# Patient Record
Sex: Female | Born: 1990
Health system: Southern US, Community
[De-identification: ages and names within clinical notes are randomized; demographics above are authoritative.]

## PROBLEM LIST (undated history)

## (undated) HISTORY — PX: NO PAST SURGERIES: SHX2092

---

## 2015-01-11 LAB — HM PAP SMEAR: HM Pap smear: NEGATIVE

## 2018-05-20 LAB — HM PAP SMEAR: HM Pap smear: NEGATIVE

## 2019-07-28 MED FILL — CARISOPRODOL 350 MG TABS: 350 | 30 days supply | Qty: 90 | Fill #0

## 2019-07-28 MED FILL — buPROPion HCL ER (XL) 150 M: 150 | 90 days supply | Qty: 90 | Fill #0

## 2019-08-17 ENCOUNTER — Ambulatory Visit (INDEPENDENT_AMBULATORY_CARE_PROVIDER_SITE_OTHER): Payer: No Typology Code available for payment source | Admitting: Physician Assistant

## 2019-08-17 ENCOUNTER — Encounter: Payer: Self-pay | Admitting: Physician Assistant

## 2019-08-17 ENCOUNTER — Encounter

## 2019-08-17 ENCOUNTER — Other Ambulatory Visit: Payer: Self-pay

## 2019-08-17 VITALS — BP 102/62 | HR 116 | Temp 97.1°F | Ht 64.0 in | Wt 114.0 lb

## 2019-08-17 DIAGNOSIS — M542 Cervicalgia: Secondary | ICD-10-CM | POA: Diagnosis not present

## 2019-08-17 DIAGNOSIS — Z30011 Encounter for initial prescription of contraceptive pills: Secondary | ICD-10-CM

## 2019-08-17 DIAGNOSIS — F329 Major depressive disorder, single episode, unspecified: Secondary | ICD-10-CM

## 2019-08-17 DIAGNOSIS — R222 Localized swelling, mass and lump, trunk: Secondary | ICD-10-CM | POA: Diagnosis not present

## 2019-08-17 DIAGNOSIS — F32A Depression, unspecified: Secondary | ICD-10-CM

## 2019-08-17 DIAGNOSIS — F419 Anxiety disorder, unspecified: Secondary | ICD-10-CM

## 2019-08-17 MED ORDER — BUPROPION HCL ER (XL) 150 MG PO TB24: 150.0000 mg | ORAL_TABLET | Freq: Every day | ORAL | 3 refills | Status: DC

## 2019-08-17 MED ORDER — LO LOESTRIN FE 1 MG-10 MCG / 10 MCG PO TABS: ORAL_TABLET | ORAL | 3 refills | Status: DC

## 2019-08-17 NOTE — Progress Notes (Signed)
Patient: Stephanie Russell Female    DOB: [birth year]   28 y.o.   MRN: [id number] Visit Date: 08/17/2019  Today's Provider: [name], PA-C   Chief Complaint  Patient presents with  . Establish Care   Subjective:     HPI   Stephanie Russell presents today to establish Care. She has recently moved to the area from [place], [place]. 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    Living in [place] with husband of two years who is currently working as a Research officer, political party. No children. Dog [employer] and [place] cat.   She believes she has had a tetanus shot in the past 10 years.   Depression: using wellbutrin 150 mg daily for several years and this is working well for her. She feels her mood is stable.     Office Visit from 08/17/2019 in [facility]  PHQ-9 Total Score  3     Contraception: On lo-loestrin. No history of heart attack, blood clot, or stroke. Doing well with this and she would like to continue.   Neck Pain. She reports this occurred after a work injury in 2018. She woke up and was unable to move it. Went to massage therapist for muscular injury after injury first happened. Saw chiropractor 6 months after that. Still reports daily pain. Most recently she had had facial tingling and also pain and throbbing behind her right eye. She denies nausea and vomiting but reports photophobia and phonophobia. She will take a Soma and sleep when this happens but will feel very sedated when she does this. She is interested in alternative treatments.   She has a lump in her lower right back that has gotten somewhat bigger since April. She denies redness, swelling, discharge, or pain.      Not on File   Current Outpatient Medications:  .  buPROPion (WELLBUTRIN XL) 150 MG 24 hr tablet, , Disp: , Rfl:  .  carisoprodol (SOMA) 350 MG tablet, , Disp: , Rfl:  .  hydrOXYzine (ATARAX/VISTARIL) 10 MG tablet, Take 10 mg by mouth every 8 (eight) hours  as needed., Disp: , Rfl:  .  LO LOESTRIN FE 1 MG-10 MCG / 10 MCG tablet, TK 1 T PO D, Disp: , Rfl:   Review of Systems  Constitutional: Negative.   HENT: Negative.   Eyes: Positive for photophobia. Negative for pain, discharge, redness, itching and visual disturbance.  Respiratory: Negative.   Cardiovascular: Negative.   Gastrointestinal: Negative.   Endocrine: Negative.   Genitourinary: Negative.   Musculoskeletal: Positive for neck pain. Negative for arthralgias, back pain, gait problem, joint swelling, myalgias and neck stiffness.  Skin: Negative.   Allergic/Immunologic: Negative.   Neurological: Positive for headaches. Negative for dizziness, tremors, seizures, syncope, facial asymmetry, speech difficulty, weakness, light-headedness and numbness.  Hematological: Negative.   Psychiatric/Behavioral: Negative for agitation, behavioral problems, confusion, decreased concentration, dysphoric mood, hallucinations, self-injury, sleep disturbance and suicidal ideas. The patient is nervous/anxious. The patient is not hyperactive.     Social History   Tobacco Use  . Smoking status: Never Smoker  . Smokeless tobacco: Never Used  Substance Use Topics  . Alcohol use: Yes      Objective:   BP 102/62 (BP Location: Left Arm, Patient Position: Sitting, Cuff Size: Normal)   Pulse (!) 116   Temp (!) 97.1 F (36.2 C) (Temporal)   Ht 5\' 4"  (1.626 m)   Wt  114 lb (51.7 kg)   SpO2 99%   BMI 19.57 kg/m  Vitals:   08/17/19 1501  BP: 102/62  Pulse: (!) 116  Temp: (!) 97.1 F (36.2 C)  TempSrc: Temporal  SpO2: 99%  Weight: 114 lb (51.7 kg)  Height: 5\' 4"  (1.626 m)  Body mass index is 19.57 kg/m.   Physical Exam Constitutional:      Appearance: Normal appearance. She is normal weight.  Neck:     Musculoskeletal: Normal range of motion. Pain with movement and muscular tenderness present.  Cardiovascular:     Rate and Rhythm: Normal rate and regular rhythm.     Heart sounds: Normal  heart sounds.  Pulmonary:     Effort: Pulmonary effort is normal.     Breath sounds: Normal breath sounds.  Musculoskeletal:       Arms:  Skin:    General: Skin is warm and dry.  Neurological:     Mental Status: She is alert and oriented to person, place, and time. Mental status is at baseline.  Psychiatric:        Mood and Affect: Mood normal.        Behavior: Behavior normal.      No results found for any visits on 08/17/19.     Assessment & Plan    1. Encounter for initial prescription of contraceptive pills  Refilled. Requesting PAP smear from OBGYN.   - LO LOESTRIN FE 1 MG-10 MCG / 10 MCG tablet; TK 1 T PO D  Dispense: 3 Package; Refill: 3  2. Anxiety and depression  Continue.  - buPROPion (WELLBUTRIN XL) 150 MG 24 hr tablet; Take 1 tablet (150 mg total) by mouth daily.  Dispense: 90 tablet; Refill: 3  3. Neck pain  Recommend PT. Could also consider cymbalta or referral to neurologist if not improving.  - Ambulatory referral to Physical Therapy  4. Palpable mass of lower back  Feels cystic. May refer to surgery. She is OK with observing for now but may refer to surgery at any time or if worsening.   The entirety of the information documented in the History of Present Illness, Review of Systems and Physical Exam were personally obtained by me. Portions of this information were initially documented by Ashley Royalty, CMA and reviewed by me for thoroughness and accuracy.   F/u 1 year CPE     [name], PA-C  [facility]

## 2019-08-17 NOTE — Patient Instructions (Signed)
Musculoskeletal Pain Musculoskeletal pain refers to aches and pains in your bones, joints, muscles, and the tissues that surround them. This pain can occur in any part of the body. It can last for a short time (acute) or a long time (chronic). A physical exam, lab tests, and imaging studies may be done to find the cause of your musculoskeletal pain. Follow these instructions at home:  Lifestyle  Try to control or lower your stress levels. Stress increases muscle tension and can worsen musculoskeletal pain. It is important to recognize when you are anxious or stressed and learn ways to manage it. This may include: ? Meditation or yoga. ? Cognitive or behavioral therapy. ? Acupuncture or massage therapy.  You may continue all activities unless the activities cause more pain. When the pain gets better, slowly resume your normal activities. Gradually increase the intensity and duration of your activities or exercise. Managing pain, stiffness, and swelling  Take over-the-counter and prescription medicines only as told by your health care provider.  When your pain is severe, bed rest may be helpful. Lie or sit in any position that is comfortable, but get out of bed and walk around at least every couple of hours.  If directed, apply heat to the affected area as often as told by your health care provider. Use the heat source that your health care provider recommends, such as a moist heat pack or a heating pad. ? Place a towel between your skin and the heat source. ? Leave the heat on for 20-30 minutes. ? Remove the heat if your skin turns bright red. This is especially important if you are unable to feel pain, heat, or cold. You may have a greater risk of getting burned.  If directed, put ice on the painful area. ? Put ice in a plastic bag. ? Place a towel between your skin and the bag. ? Leave the ice on for 20 minutes, 2-3 times a day. General instructions  Your health care provider may  recommend that you see a physical therapist. This person can help you come up with a safe exercise program. Do any exercises as told by your physical therapist.  Keep all follow-up visits, including any physical therapy visits, as told by your health care providers. This is important. Contact a health care provider if:  Your pain gets worse.  Medicines do not help ease your pain.  You cannot use the part of your body that hurts, such as your arm, leg, or neck.  You have trouble sleeping.  You have trouble doing your normal activities. Get help right away if:  You have a new injury and your pain is worse or different.  You feel numb or you have tingling in the painful area. Summary  Musculoskeletal pain refers to aches and pains in your bones, joints, muscles, and the tissues that surround them.  This pain can occur in any part of the body.  Your health care provider may recommend that you see a physical therapist. This person can help you come up with a safe exercise program. Do any exercises as told by your physical therapist.  Lower your stress level. Stress can worsen musculoskeletal pain. Ways to lower stress may include meditation, yoga, cognitive or behavioral therapy, acupuncture, and massage therapy. This information is not intended to replace advice given to you by your health care provider. Make sure you discuss any questions you have with your health care provider. Document Released: 11/02/2005 Document Revised: 10/15/2017 Document Reviewed:   12/02/2016 Elsevier Patient Education  2020 Elsevier Inc.  

## 2019-08-31 ENCOUNTER — Ambulatory Visit: Payer: No Typology Code available for payment source | Attending: Physician Assistant | Admitting: Physical Therapy

## 2019-08-31 ENCOUNTER — Encounter: Payer: Self-pay | Admitting: Physical Therapy

## 2019-08-31 ENCOUNTER — Other Ambulatory Visit: Payer: Self-pay

## 2019-08-31 DIAGNOSIS — M6281 Muscle weakness (generalized): Secondary | ICD-10-CM | POA: Diagnosis present

## 2019-08-31 DIAGNOSIS — M542 Cervicalgia: Secondary | ICD-10-CM | POA: Diagnosis present

## 2019-08-31 DIAGNOSIS — M5412 Radiculopathy, cervical region: Secondary | ICD-10-CM

## 2019-08-31 NOTE — Addendum Note (Signed)
Addended by: Rosita Kea R on: 08/31/2019 04:21 PM   Modules accepted: Orders

## 2019-08-31 NOTE — Therapy (Signed)
[facility] [address] [place], [address] Phone: [phone]   Fax:  [phone]  Physical Therapy Evaluation  Patient Details  Name: Stephanie Russell MRN: [id number] Date of Birth: 1991/08/30 Referring Provider (PT): [name], [place]   Encounter Date: 08/31/2019  PT End of Session - 08/31/19 [id number]    Visit Number  1    Number of Visits  12    Date for PT Re-Evaluation  10/12/19    Authorization Type  [facility] focus reporting from 08/31/2019    Authorization - Visit Number  1    Authorization - Number of Visits  10    PT Start Time  0900    PT Stop Time  0950    PT Time Calculation (min)  50 min    Activity Tolerance  Patient tolerated treatment well    Behavior During Therapy  [facility] for tasks assessed/performed       History reviewed. No pertinent past medical history.  Past Surgical History:  Procedure Laterality Date  . NO PAST SURGERIES      There were no vitals filed for this visit.   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 The pain is currently 2/10, and the worse pain can go up to 4/10. Patient has constant migraines and gradually develop numbness around R jaw and face. No previous TMJ issues.  Patient is currently a full time student. Patient enjoys doing hot yoga but not able to participate now due to neck and shoulder issue.    Pertinent History  Overall healthy and no past surgeries.    Limitations  Lifting;House hold activities    Patient Stated Goals  Reduce headache and neck pain; to be able to have better sleep    Currently in Pain?  Yes    Pain Score  2     Pain Location  Neck    Pain Orientation  Right    Pain Descriptors / Indicators  Aching;Constant    Pain Type  Chronic pain    Pain Radiating Towards   to R shoulder, headache, and jaw.    Pain Onset  More than a month ago    Pain Frequency  Intermittent    Aggravating Factors   worse at night, lifting up to 15 lb, pulling with R arm.    Pain Relieving Factors  pain medication; breaks    Effect of Pain on Daily Activities  Taking care of others, hot yoga practice.         [facility] PT Assessment - 08/31/19 0001      Assessment   Referring Provider (PT)  [name], PA-C    Onset Date/Surgical Date  --   December 2018    Hand Dominance  Right    Next MD Visit  --   in a year    Prior Therapy  None       Precautions   Precautions  None      Restrictions   Weight Bearing Restrictions  No      Home Environment   Living Environment  Private residence    Living Arrangements  Spouse/significant other    Available Help at Discharge  Family    Type of [place]  Prior Function   Level of Independence  Independent    Vocation  Student      Cognition   Overall Cognitive Status  Within Functional Limits for tasks assessed      Observation/Other Assessments   Observations  see note from 08/31/2019 for latest objective data    Focus on Therapeutic Outcomes (FOTO)   80       [facility] PT Assessment - 08/31/19 0001      Assessment   Referring Provider (PT)  [name], PA-C    Onset Date/Surgical Date  --   December 2018    Hand Dominance  Right    Next MD Visit  --   in a year    Prior Therapy  None       Precautions   Precautions  None      Restrictions   Weight Bearing Restrictions  No      Home Environment   Living Environment  Private residence    Living Arrangements  Spouse/significant other    Available Help at Discharge  Family    Type of [place]      Prior Function   Level of Independence  Independent    Vocation  Student      Cognition   Overall Cognitive Status  Within Functional Limits for tasks assessed      Observation/Other Assessments   Observations  see note from  08/31/2019 for latest objective data    Focus on Therapeutic Outcomes (FOTO)   53       OBJECTIVE SENSATION: Grossly intact to light touch bilateral UE as determined by testing dermatomes C2-T2   MUSCULOSKELETAL: Tremor: None Bulk: Normal Tone: Normal  Posture Slight forward neck posture and thoracic kyphosis  Palpation No pain when palpating around neck and shoulder areas bilateral in sitting.    Strength R/L 4/4 Shoulder flexion (anterior deltoid/pec major/coracobrachialis, axillary n. (C5/6) and musculocutaneous n. (C5-7)) 4/4 Shoulder abduction (deltoid/supraspinatus, axillary/suprascapular n, C5) 4+/4+ Elbow flexion (biceps brachii, brachialis, brachioradialis, musculoskeletal n, C5/6) 4/4 Elbow extension (triceps, radial n, C7) 4+/4+ Finger adduction (interossei, ulnar n, T1)   AROM R/L Cervical Flexion 45* Cervical Extension WFL Cervical Lateral Flexion: R:40* L: WFL Cervical Rotation: WFL *Indicates pain, overpressure performed unless otherwise indicated   Repeated Movements Deferred to later session.    SPECIAL TESTS ULTT Median: R: Positive L: Negative ULTT Ulnar: R: Negative L: Negative ULTT Radial: R: Positive  L: Negative  Objective measurements completed on examination: See above findings.   TREATMENT:  Therapeutic exercise: to centralize symptoms and improve ROM, strength, muscular endurance, and activity tolerance required for successful completion of functional activities.  - Standing/supine Median nerve glide x 5 on R side UE - Standing/supine Radial nerve glide x 5 on R side UE - Patient was educated on diagnosis, anatomy and pathology involved, prognosis, role of PT, and was given an HEP, demonstrating exercise with proper form following verbal and tactile cues, and was given a paper hand out to continue exercise at home. Pt was educated on and agreed to plan of care.   HOME EXERCISE PROGRAM Access Code: [email or web address]  URL:  [email or web address]  Date: 08/31/2019  Prepared by: Rosita Kea   Exercises Median Nerve Flossing - Tray - 3 sets - 10 reps - 1x daily - 7x weekly Standing Radial Nerve Glide - 3 sets - 10 reps - 1x daily - 7x weekly      PT Education - 08/31/19 1306  Education Details  Education: Exercise purpose/form. Self management techniques. Education on diagnosis, prognosis, POC, anatomy and physiology of current condition Education on HEP including handout    Person(s) Educated  Patient    Methods  Explanation;Demonstration;Tactile cues;Verbal cues;Handout    Comprehension  Verbalized understanding;Returned demonstration;Verbal cues required;Tactile cues required        PT Short Term Goals - 08/31/19 1308      PT SHORT TERM GOAL #1   Title  Be independent with initial home exercise program for self-management of symptoms.    Baseline  initial HEP provided at IE (08/31/2019);    Time  2    Period  Weeks    Status  New    Target Date  09/14/19        PT Long Term Goals - 08/31/19 1308      PT LONG TERM GOAL #1   Title  Be independent with a long-term home exercise program for self-management of symptoms.    Baseline  initial HEP provided at IE (08/31/2019);    Time  6    Period  Weeks    Status  New    Target Date  10/12/19      PT LONG TERM GOAL #2   Title  Demonstrate improved FOTO score by 10 units to demonstrate improvement in overall condition and self-reported functional ability.    Baseline  53 (08/31/2019);    Time  6    Period  Weeks    Status  New    Target Date  10/12/19      PT LONG TERM GOAL #3   Title  Reduce pain with functional activities to equal or less than 1/10 to allow patient to complete usual activities including ADLs, IADLs, and social engagement with less difficulty.    Baseline  2-6/10 wose at night (08/31/2019);    Time  6    Period  Weeks    Status  New    Target Date  10/12/19      PT LONG TERM GOAL #4   Title  Pt will  increase strength of by at least 1/2 MMT grade in order to demonstrate improvement in strength and function.    Baseline  See objective notes at IE (08/31/2019);    Time  6    Period  Weeks    Status  New    Target Date  10/12/19      PT LONG TERM GOAL #5   Title  Complete community, work and/or recreational activities (hot yoga) without limitation due to current condition.    Baseline  Unable to participate hot yoga, holding dog leash with R UE(08/31/2019);    Time  6    Period  Weeks    Status  New    Target Date  10/12/19          Plan - 08/31/19 0911    Clinical Impression Statement  Patient is a 28 y.o. female who presents to outpatient physical therapy with a referral for medical diagnosis of neck pain. This patient presents with the sign and symptoms consistent with neck pain and R shoulder pain, weakness, R jaw and facial numbness with functional activities/recreational activities. Upon assessment, pt demonstrated deficits such as deficits in strength, mobility with pain at end range, and nerve tension (medium and radial) at R UE. These deficits limit the patient ability to perform things such as ADLs(sleep, headache), IADLs(walking dogs), and impairs their quality of life. The pt will benefit from  skilled PT services to address deficits and return to PLOF and independence, recreational activity and work/study.    Personal Factors and Comorbidities  Time since onset of injury/illness/exacerbation    Examination-Activity Limitations  Lift;Sleep;Carry;Sit;Other   computer work   Printmaker work   Public affairs consultant  Low    Rehab Potential  Good    PT Frequency  2x / week    PT Duration  6 weeks    PT Treatment/Interventions  ADLs/Self Care Home Management;Moist Heat;Cryotherapy;Joint Manipulations;Spinal Manipulations;Manual  techniques;Therapeutic exercise;Functional mobility training;Therapeutic activities;Electrical Stimulation;Neuromuscular re-education;Patient/family education;Passive range of motion;Dry needling    PT Next Visit Plan  strengthending, nerve glide, update HEP    PT Home Exercise Plan  Medbridge: [id number]    Consulted and Agree with Plan of Care  Patient       Patient will benefit from skilled therapeutic intervention in order to improve the following deficits and impairments:  Decreased activity tolerance, Decreased endurance, Decreased strength, Hypermobility, Pain, Impaired UE functional use, Decreased range of motion, Postural dysfunction  Visit Diagnosis: Cervicalgia  Radiculopathy, cervical region  Muscle weakness (generalized)     Problem List There are no active problems to display for this patient.   Sherrilyn Rist, SPT 08/31/19, 4:20 PM  Everlean Alstrom. Graylon Good, PT, DPT 08/31/19, 4:20 PM   [facility] [address] [place], [address] Phone: [phone]   Fax:  [phone]  Name: Stephanie Russell MRN: [id number] Date of Birth: [birth year]

## 2019-09-04 ENCOUNTER — Telehealth: Payer: Self-pay | Admitting: Physician Assistant

## 2019-09-04 ENCOUNTER — Other Ambulatory Visit: Payer: Self-pay

## 2019-09-04 ENCOUNTER — Encounter: Payer: Self-pay | Admitting: Physical Therapy

## 2019-09-04 ENCOUNTER — Ambulatory Visit: Payer: No Typology Code available for payment source | Admitting: Physical Therapy

## 2019-09-04 DIAGNOSIS — M6281 Muscle weakness (generalized): Secondary | ICD-10-CM

## 2019-09-04 DIAGNOSIS — M5412 Radiculopathy, cervical region: Secondary | ICD-10-CM

## 2019-09-04 DIAGNOSIS — M542 Cervicalgia: Secondary | ICD-10-CM | POA: Diagnosis not present

## 2019-09-04 NOTE — Telephone Encounter (Signed)
Pharmacy is needing a prior aurth on: LO LOESTRIN FE 1 MG-10 MCG / 10 MCG tablet to fill this Rx.  [place], [place] - [address] [phone] (Phone) [phone] (Fax)   [place], [place]

## 2019-09-04 NOTE — Therapy (Signed)
[facility] [address] [place], [address] Phone: [phone]   Fax:  [phone]  Physical Therapy Treatment  Patient Details  Name: Stephanie Russell MRN: [id number] Date of Birth: 1991-09-05 Referring Provider (PT): [name], [place]   Encounter Date: 09/04/2019  PT End of Session - 09/04/19 1658    Visit Number  2    Number of Visits  12    Date for PT Re-Evaluation  10/12/19    Authorization Type  [facility] focus reporting from 08/31/2019    Authorization - Visit Number  2    Authorization - Number of Visits  10    PT Start Time  [id number]    PT Stop Time  1730    PT Time Calculation (min)  45 min    Activity Tolerance  Patient tolerated treatment well    Behavior During Therapy  [facility] for tasks assessed/performed       History reviewed. No pertinent past medical history.  Past Surgical History:  Procedure Laterality Date  . NO PAST SURGERIES      There were no vitals filed for this visit.  Subjective Assessment - 09/04/19 1646    Subjective  Patient had the worst migraine yesterday and the pain travels from R side of the face, R neck and R scapula, and R deltoid. Patient rated current pain as 3-4/10. Patient has been doing her HEP and only felt numbness at R arm during the exercise and no symptom once get off from the position. Patient states that she still has same difficulites with sleeping on the R side.    Currently in Pain?  Yes    Pain Score  4     Pain Location  Neck    Pain Orientation  Right      OBJECTIVE:   REPEATED MOTIONS TESTING:  Cervical spine: Motion/Technique sets x reps During After ROM Functional test** Stoplight Comments  LOADED STARTING POSITION (seated)         R sidebending AROM x10 No effect No effect   Amber   retraction x10 No effect No effect   Amber   Retraction w/ self overpressure (OP) 2x10 ERP at L subocciptital region No worse   amber   Retraction  w/clinician OP  x10 ERP at L subocciptial region No worse   Amber    Retraction w/self OP to extension x3 peripheralizing to med scapula, shoulder peripheralized to same nil  red Required self-assistance to move head in and out of extension due to weakness  Retraction with upper c-spine flexion w/self OP X5, x10 ERP to R shoulder and scapula No worse   amber   R sidebending w/self OP 2x10  May feel better slightly at medial scapula nil  amber   UNLOADED STARTING POSITION (flat supine)         Retraction with clinician OP 2x10 R foreararm paresthesia peripheralized   red   Retraction with upper c-spine flexion with gentle clinician OP x5 unclear unclear   amber   R side bending w/clinician OP at several levels 2x10 Feels good stretch Abolished shoulder pain, jaw pain, and face pain. Scapular pain decreased improved pain Improved pain green Notes feels best at upper cervical spine, so second set completed focused here.   R sidebending w/self OP x10     green For HEP           **Functional test: sitting position Comments: palpation of suboccipital region  performed between supine retraction with clinician OP and retraction with clinician OP to flexion. Noted for tightness and concordant pain at R suboccipital region compared to L. Patient demo and voiced feeling of cervical spine weakness when moving to extension and flexion in upright posture.   TREATMENT:  Therapeutic exercise: to centralize symptoms and improve ROM, strength, muscular endurance, and activity tolerance required for successful completion of functional activities.  - Performed repeated motions testing resulting in apparent R side lateral directional preference. Testing performed in this direction performed as treatment as well as assessment.  - Education on diagnosis, prognosis, POC, anatomy and physiology of current condition.  - Education on HEP including update to complete R cervical sidebending with self OP in supine or seated  (with partial retraction). 10x every 2 hours until next session, as treatment and to further assess directional preference.   Manual therapy: to reduce pain and tissue tension, improve range of motion, neuromodulation, in order to promote improved ability to complete functional activities. - performed repeated motions testing with clinician OP (see above) in several positions to further assess condition and as treatment.  - Brief palpation to suboccipital muscles, noted for R tighter and produced concordant pain compared to L.       HOME EXERCISE PROGRAM Access Code: [email or web address]  URL: [email or web address]  Date: 09/04/2019  Prepared by: Rosita Kea   Exercises  Median Nerve Flossing - Tray - 3 sets - 10 reps - 1x daily - 7x weekly  Standing Radial Nerve Glide - 3 sets - 10 reps - 1x daily - 7x weekly  Seated/or supine Cervical Sidebend with self Overpressure - Repeated Motions - 10-15 reps - 1 second hold - 10 every 2 hours     Clinical impression:  Patient tolerated treatment well and demo centralization and directional preference for R cervical sidebending. Interventions matched to this classification decreased overall pain to 2/10, abolished pain in the jaw, face, and shoulder and decreased pain in the periscapular region by end of session. Patient showed good understanding of new instructions for HEP and voiced wilingess to participate as prescribed. Patient demo weakness and poor motor control in the cervical spine during upright extension and flexion AROM as well as tightness at suboccipital region restricting upper cervical flexion and would likely benefit from cervical spine strengthening and upper cervical spine stretch and soft tissue mobilization if her condition is not fully resolved by repeated motions and self-generated forces. Plan to continue exploring this next session if appropriate based on clinical presentation.  Patient would benefit from continued physical  therapy to address remaining impairments and functional limitations to work towards stated goals and return to PLOF or maximal functional independence.     PT Education - 09/04/19 1658    Education Details  Exercise purpose/form. Self management techniques.    Person(s) Educated  Patient    Methods  Explanation;Demonstration;Tactile cues;Verbal cues    Comprehension  Verbalized understanding;Returned demonstration;Verbal cues required;Tactile cues required       PT Short Term Goals - 08/31/19 1308      PT SHORT TERM GOAL #1   Title  Be independent with initial home exercise program for self-management of symptoms.    Baseline  initial HEP provided at IE (08/31/2019);    Time  2    Period  Weeks    Status  New    Target Date  09/14/19        PT Long Term Goals - 08/31/19 1308  PT LONG TERM GOAL #1   Title  Be independent with a long-term home exercise program for self-management of symptoms.    Baseline  initial HEP provided at IE (08/31/2019);    Time  6    Period  Weeks    Status  New    Target Date  10/12/19      PT LONG TERM GOAL #2   Title  Demonstrate improved FOTO score by 10 units to demonstrate improvement in overall condition and self-reported functional ability.    Baseline  53 (08/31/2019);    Time  6    Period  Weeks    Status  New    Target Date  10/12/19      PT LONG TERM GOAL #3   Title  Reduce pain with functional activities to equal or less than 1/10 to allow patient to complete usual activities including ADLs, IADLs, and social engagement with less difficulty.    Baseline  2-6/10 wose at night (08/31/2019);    Time  6    Period  Weeks    Status  New    Target Date  10/12/19      PT LONG TERM GOAL #4   Title  Pt will increase strength of by at least 1/2 MMT grade in order to demonstrate improvement in strength and function.    Baseline  See objective notes at IE (08/31/2019);    Time  6    Period  Weeks    Status  New    Target Date   10/12/19      PT LONG TERM GOAL #5   Title  Complete community, work and/or recreational activities (hot yoga) without limitation due to current condition.    Baseline  Unable to participate hot yoga, holding dog leash with R UE(08/31/2019);    Time  6    Period  Weeks    Status  New    Target Date  10/12/19            Plan - 09/04/19 1700    Clinical Impression Statement  Patient tolerated treatment well and demo centralization and directional preference for R cervical sidebending. Interventions matched to this classification decreased overall pain to 2/10, abolished pain in the jaw, face, and shoulder and decreased pain in the periscapular region by end of session. Patient showed good understanding of new instructions for HEP and voiced wilingess to participate as prescribed. Patient demo weakness and poor motor control in the cervical spine during upright extension and flexion AROM as well as tightness at suboccipital region restricting upper cervical flexion and would likely benefit from cervical spine strengthening and upper cervical spine stretch and soft tissue mobilization if her condition is not fully resolved by repeated motions and self-generated forces. Plan to continue exploring this next session if appropriate based on clinical presentation.  Patient would benefit from continued physical therapy to address remaining impairments and functional limitations to work towards stated goals and return to PLOF or maximal functional independence.    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    Rehab Potential  Good    PT Frequency  2x / week    PT Duration  6 weeks    PT Treatment/Interventions  ADLs/Self Care Home  Management;Moist Heat;Cryotherapy;Joint Manipulations;Spinal Manipulations;Manual techniques;Therapeutic exercise;Functional mobility  training;Therapeutic activities;Electrical Stimulation;Neuromuscular re-education;Patient/family education;Passive range of motion;Dry needling    PT Next Visit Plan  strengthending, nerve glide, futher explore directional preference and repeated motions if appropriate    PT Home Exercise Plan  Medbridge: [email or web address]    Consulted and Agree with Plan of Care  Patient       Patient will benefit from skilled therapeutic intervention in order to improve the following deficits and impairments:  Decreased activity tolerance, Decreased endurance, Decreased strength, Hypermobility, Pain, Impaired UE functional use, Decreased range of motion, Postural dysfunction  Visit Diagnosis: Cervicalgia  Radiculopathy, cervical region  Muscle weakness (generalized)     Problem List There are no active problems to display for this patient.   Sherrilyn Rist, SPT 09/04/19, 6:16 PM  Everlean Alstrom. Graylon Good, PT, DPT 09/04/19, 6:16 PM  [facility] [address], [place], [address] Phone: [phone]   Fax:  [phone]  Name: Stephanie Russell MRN: [id number] Date of Birth: 23-Sep-1991

## 2019-09-04 NOTE — Telephone Encounter (Signed)
Pt's husband called saying the [place] said they sent a PA for his wife BC pills and have not gotten any response from [place].  He went to the pharmacy to pick them up and called [place] because she needs to get them asap.  He would like for someone to call him back.  I told him to have the pharmacy resend the PA.      Pt's CB# [id number]    Thanks Con Memos

## 2019-09-05 NOTE — Telephone Encounter (Signed)
Received fax transmission with recommendation,  Awaiting PA to review. KW

## 2019-09-05 NOTE — Telephone Encounter (Signed)
Spoke with husband on phone who is listed under DPR, I advised him that we received pre-authorization request but I need more information from patient before proceeding with pre-auth. He states that patient is in class and will return call this afternoon to address. KW

## 2019-09-07 ENCOUNTER — Encounter: Payer: No Typology Code available for payment source | Admitting: Physical Therapy

## 2019-09-07 ENCOUNTER — Encounter: Payer: Self-pay | Admitting: Physical Therapy

## 2019-09-07 ENCOUNTER — Ambulatory Visit: Payer: No Typology Code available for payment source | Admitting: Physical Therapy

## 2019-09-07 ENCOUNTER — Other Ambulatory Visit: Payer: Self-pay

## 2019-09-07 DIAGNOSIS — M5412 Radiculopathy, cervical region: Secondary | ICD-10-CM

## 2019-09-07 DIAGNOSIS — M542 Cervicalgia: Secondary | ICD-10-CM | POA: Diagnosis not present

## 2019-09-07 DIAGNOSIS — M6281 Muscle weakness (generalized): Secondary | ICD-10-CM

## 2019-09-07 NOTE — Therapy (Signed)
[facility] [address] [place], [address] Phone: [phone]   Fax:  [phone]  Physical Therapy Treatment  Patient Details  Name: Stephanie Russell MRN: [id number] Date of Birth: 05/10/1991 Referring Provider (PT): [name], [place]   Encounter Date: 09/07/2019  PT End of Session - 09/07/19 1654    Visit Number  3    Number of Visits  12    Date for PT Re-Evaluation  10/12/19    Authorization Type  [facility] focus reporting from 08/31/2019    Authorization - Visit Number  3    Authorization - Number of Visits  10    PT Start Time  [id number]    PT Stop Time  1745   15 min unbilled due to scheduling overlap with 2nd patient resulting in this time not being one on one.   PT Time Calculation (min)  55 min    Activity Tolerance  Patient tolerated treatment well    Behavior During Therapy  [facility] for tasks assessed/performed       History reviewed. No pertinent past medical history.  Past Surgical History:  Procedure Laterality Date  . NO PAST SURGERIES      There were no vitals filed for this visit.  Subjective Assessment - 09/07/19 1655    Subjective  Patient reports she is feeling okay today but yesterday was not a good day. Her sympotms were up to 6/10 yesterday and she felt she could not go out on a walk. She currently has R shoulder pain and irritation (2/10) and no pain in her head/jaw/face. Her test went well yesterday. She states she was kind of sore following her last treatment session but not in a bad way. She has been doing her HEP. She tried when her headache was really bad and she felt like she couldn't really do them.    Pertinent History  Overall healthy and no past surgeries.    Limitations  Lifting;House hold activities    Patient Stated Goals  Reduce headache and neck pain; to be able to have better sleep    Currently in Pain?  Yes    Pain Score  2        OBJECTIVE:   REPEATED  MOTIONS TESTING:  Cervical spine: Motion/Technique sets x reps During After ROM Functional test** Stoplight Comments  LOADED STARTING POSITION (seated)         R sidebending with self OP x10 Left stretch No effect   Amber   R rotation AROM x10 peripheralizing to R tricep ER No worse   Amber   R rotation w/ self overpressure (OP) 2x10 peripheralizing to R tricep at end range.  No worse   amber   R rotation w/clinician OP  2x10 Feels good stretch, peripheralizing to R tricep at end range No worse, scapular pain gone   Amber  discontinued after two sets to prevent secondary irritation from OP  R rotation with self OP x10 peripheralizing to R tricep at end range No worse, scapular pain remains gone nil  Nurse, [facility] for HEP           **Functional test: none today  TREATMENT:  Therapeutic exercise:to centralize symptoms and improve ROM, strength, muscular endurance, and activity tolerance required for successful completion of functional activities.   Continued repeated motions testing/treatment (see above).   Education on diagnosis, prognosis, POC, anatomy and physiology of current condition.   Education on HEP including update  to complete R cervical rotation with self OP in supine.. 10x every 2 hours until next session, as treatment and to further assess directional preference. Discontinued side-bending due to no significant improvement reported.   Completed Yellow Flag Risk Form (unbilled time, see result below).   Neuromuscular Re-education: to improve, balance, postural strength, muscle activation patterns, and stabilization strength required for functional activities:  Supine deep neck flexor training with biofeedback cuff. 5 second hold each 2 mmHg starting at 22 up to 30, then up to 40. Pt demo good understanding of activity.   Supine deep neck flexor training x5 with towel roll behind cervical lordosis and x 5 with towel roll posterior to occiput (patient  found more difficult). 10 second holds each rep. To improve transfer of motor control and skill from with biofeedback to potential HEP. To improve deep flexor strength, endurance, and cervical postural control.   Manual therapy: to reduce pain and tissue tension, improve range of motion, neuromodulation, in order to promote improved ability to complete functional activities. - performed repeated motions testing with clinician OP (see above) in seated to further assess and treat condition. - supine STM to posterior cervical musculature with R suboccipital trigger point release. Very tight at R suboccipital region, softened with pressure. Pt states produces concordant pain to R eye.      HOME EXERCISE PROGRAM Access Code: [email or web address]  URL: [email or web address]  Date: 09/07/2019  Prepared by: Rosita Kea   Exercises  Median Nerve Flossing - Tray - 3 sets - 10 reps - 1x daily - 7x weekly  Standing Radial Nerve Glide - 3 sets - 10 reps - 1x daily - 7x weekly  Standing Cervical Rotation AROM with Overpressure - repeated motions - 10-15 reps - 1 second hold - 10-20 every 2 hours  Supine Head Nod with Deep Neck Flexor Activation - 2 sets - 10 reps - 10 seconds hold - 1x daily (held, saved for later)  Clinical impression:  Patient tolerated treatment well and demo possible directional preference for R cervical rotation. Updated HEP accordingly. Patient reported pain reduction from 2/10 to 1/10 by end of session and abolishment of R periscapular pain. Continued to have constant faint paresthesia in R UE to wrist, that did not improve or worsen over course of session. Patient with concordant pain over tight and tender R subocipital muscles that responded well to trigger point release. Patient also able to perform deep neck flexor training with good form. May benefit from continued manual therapy including possible dry needling and initiation of neck isometrics next session. Patient  continues to have pain, headaches, and paresthesia that is significantly limiting her function and quality of life. Patient would benefit from continued physical therapy to address remaining impairments and functional limitations to work towards stated goals and return to PLOF or maximal functional independence.    Yellow Flag Risk Form  1. Please indicate your usual level of pain during the past week. No Pain     Worst pain possible 0    1    2    3    4    5    6    7    8    9    10   2. Does pain, numbness, tingling, or weakness, extend into your leg (from back) &/or arm (from neck)? None of the time    All of the time 0    1    2    3  4    5    6    7    8    9    10   3. How would you rate your general health? Poor     Excellent  (score 10-X) 0    1    2    3    4    5    6    7    8    9    10   4. If you had to spend the rest of your life with your condition as it is right now, how would you feel about it? Delighted     Terrible 0    1    2    3    4    5    6    7    8    9    10   5. How anxious (eg. Tense, uptight, irritable, fearful, difficulty in concentrating / relaxing) have you been feeling during the past week? Not at all     Extremely anxious 0    1    2    3    4    5    6    7    8    9    10   6. How much have you been able to control (ie. Reduce / help) your pain / complaint on your own during the past week? I can reduce it    I can't reduce it at all 0    1    2    3    4    5    6    7    8    9    10   7. Please indicate how depressed (eg. down in dumps, sad, downhearted, in low spiritis, pessimistic, feelings of hopelessness) have you been feeling in the past week. Not depressed at all   Extremely depressed 0    1    2    3    4    5    6    7    8    9    10   8. One a scale of [id number], how certain are you that you will be doing normal activities or working in six months? Very certain    Not certain at all 0    1    2    3    4    5    6    7    8    9     10   9. I can do light work for an hour. Completely agree   Completely disagree 0    1    2    3    4    5    6    7    8    9    10   10. I can sleep at night. Completely agree   Completely disagree 0    1    2    3    4    5    6    7    8    9    10   11. An increase in pain is an indication that I should stop what I am doing until the pain decreases. Completely disagree   Completely agree 0  1    2    3    4    5    6    7    8    9    10   12. Physical activity makes my pain worse. Completely disagree   Completely agree 0    1    2    3    4    5    6    7    8    9    10   13. I should not do my normal activities including work, with my pain present. Completely disagree   Completely agree 0    1    2    3    4    5    6    7    8    9    10   Total score: 51/130  (score question 3 as 10-X)  Interpretation:  50-60 = moderate risk for psychosocial factors (yellow)  Three main domains related to slower recovery and delay of normal activity:  1. Fear/escape avoidance behavior towards physical activity (#9, 11, 12, 13) 2. Confidence in general health and condition (#3, 4) and pain control and normal ability (#6, 8) 3. Emotion and social well being - depression and anxiety (#5, 7)  YFRF Pattern 1 = Central sensitivity pain mechanism (high questions = 9, 11, 12, 13) YFRF Patter 2 = Affective pain mechanism (high questions = 3, 4, 5, 6, 7, 8) YFRF Pattern 3 = Peripheral neurogenic pain mechanism (high questions = 1, 2, 6,12)    PT Education - 09/07/19 1654    Education Details  Exercise purpose/form. Self management techniques.    Person(s) Educated  Patient    Methods  Explanation;Demonstration;Tactile cues;Verbal cues    Comprehension  Verbalized understanding;Returned demonstration       PT Short Term Goals - 08/31/19 1308      PT SHORT TERM GOAL #1   Title  Be independent with initial home exercise program for self-management of symptoms.    Baseline  initial HEP  provided at IE (08/31/2019);    Time  2    Period  Weeks    Status  New    Target Date  09/14/19        PT Long Term Goals - 08/31/19 1308      PT LONG TERM GOAL #1   Title  Be independent with a long-term home exercise program for self-management of symptoms.    Baseline  initial HEP provided at IE (08/31/2019);    Time  6    Period  Weeks    Status  New    Target Date  10/12/19      PT LONG TERM GOAL #2   Title  Demonstrate improved FOTO score by 10 units to demonstrate improvement in overall condition and self-reported functional ability.    Baseline  53 (08/31/2019);    Time  6    Period  Weeks    Status  New    Target Date  10/12/19      PT LONG TERM GOAL #3   Title  Reduce pain with functional activities to equal or less than 1/10 to allow patient to complete usual activities including ADLs, IADLs, and social engagement with less difficulty.    Baseline  2-6/10 wose at night (08/31/2019);    Time  6    Period  Weeks    Status  New  Target Date  10/12/19      PT LONG TERM GOAL #4   Title  Pt will increase strength of by at least 1/2 MMT grade in order to demonstrate improvement in strength and function.    Baseline  See objective notes at IE (08/31/2019);    Time  6    Period  Weeks    Status  New    Target Date  10/12/19      PT LONG TERM GOAL #5   Title  Complete community, work and/or recreational activities (hot yoga) without limitation due to current condition.    Baseline  Unable to participate hot yoga, holding dog leash with R UE(08/31/2019);    Time  6    Period  Weeks    Status  New    Target Date  10/12/19            Plan - 09/07/19 1809    Clinical Impression Statement  Patient tolerated treatment well and demo possible directional preference for R cervical rotation. Updated HEP accordingly. Patient reported pain reduction from 2/10 to 1/10 by end of session and abolishment of R periscapular pain. Continued to have constant faint  paresthesia in R UE to wrist, that did not improve or worsen over course of session. Patient with concordant pain over tight and tender R subocipital muscles that responded well to trigger point release. Patient also able to perform deep neck flexor training with good form. May benefit from continued manual therapy including possible dry needling and initiation of neck isometrics next session. Patient continues to have pain, headaches, and paresthesia that is significantly limiting her function and quality of life. Patient would benefit from continued physical therapy to address remaining impairments and functional limitations to work towards stated goals and return to PLOF or maximal functional independence.    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    Rehab Potential  Good    PT Frequency  2x / week    PT Duration  6 weeks    PT Treatment/Interventions  ADLs/Self Care Home Management;Moist Heat;Cryotherapy;Joint Manipulations;Spinal Manipulations;Manual techniques;Therapeutic exercise;Functional mobility training;Therapeutic activities;Electrical Stimulation;Neuromuscular re-education;Patient/family education;Passive range of motion;Dry needling    PT Next Visit Plan  isometrics, manual, deep neck flexor strengthening, consider needling    PT Home Exercise Plan  Medbridge: [email or web address]    Consulted and Agree with Plan of Care  Patient       Patient will benefit from skilled therapeutic intervention in order to improve the following deficits and impairments:  Decreased activity tolerance, Decreased endurance, Decreased strength, Hypermobility, Pain, Impaired UE functional use, Decreased range of motion, Postural dysfunction  Visit  Diagnosis: Cervicalgia  Radiculopathy, cervical region  Muscle weakness (generalized)     Problem List There are no active problems to display for this patient.   Everlean Alstrom. Graylon Good, PT, DPT 09/07/19, 6:28 PM  [facility] [address] [place], [address] Phone: [phone]   Fax:  [phone]  Name: Evamarie Bluth MRN: [id number] Date of Birth: 1991/07/18

## 2019-09-08 ENCOUNTER — Encounter: Payer: Self-pay | Admitting: Physician Assistant

## 2019-09-08 NOTE — Telephone Encounter (Signed)
Can we check on prior auth and let patient know the status? Thanks.

## 2019-09-11 ENCOUNTER — Encounter: Payer: No Typology Code available for payment source | Admitting: Physical Therapy

## 2019-09-11 NOTE — Telephone Encounter (Signed)
Pt's husband called asking about the birth control and if the PA has been done.  His wife is about to run out of the Rx and needs a  Refill asap.  Thanks  [employer]

## 2019-09-12 ENCOUNTER — Ambulatory Visit: Payer: No Typology Code available for payment source | Admitting: Physical Therapy

## 2019-09-12 ENCOUNTER — Other Ambulatory Visit: Payer: Self-pay

## 2019-09-12 DIAGNOSIS — M5412 Radiculopathy, cervical region: Secondary | ICD-10-CM

## 2019-09-12 DIAGNOSIS — M542 Cervicalgia: Secondary | ICD-10-CM | POA: Diagnosis not present

## 2019-09-12 DIAGNOSIS — M6281 Muscle weakness (generalized): Secondary | ICD-10-CM

## 2019-09-12 NOTE — Telephone Encounter (Signed)
Patient's call was returned and she was advised that the PA will be completed tomorrow 09/12/2019.

## 2019-09-12 NOTE — Telephone Encounter (Signed)
Pt called back saying she spoke with the pharmacy.  The pharmacy faxed the request on 09-04-19, faxed it earlier today and again before she called.  Thanks, [employer]

## 2019-09-12 NOTE — Telephone Encounter (Signed)
Pt's husband is call back again regarding his wife's BC.  He is frustrated because this has not be resolved with the prior auth for pt's refill.  She is needing to have this BC because of pt's depression.  Please call pt's husband back to let him know the status.  Thanks, [employer]

## 2019-09-12 NOTE — Telephone Encounter (Signed)
Pt's husband calling back for the 2nd time today checking on the prior auth of his wife's BC. LO LOESTRIN FE 1 MG-10 MCG / 10 MCG tablet.  Pt is frustrated since it's been a week now.   Thanks, [employer]

## 2019-09-12 NOTE — Therapy (Signed)
[facility] [address], [place], [address] Phone: [phone]   Fax:  [phone]  Physical Therapy Treatment  Patient Details  Name: Stephanie Russell MRN: [id number] Date of Birth: 08-Aug-1991 Referring Provider (PT): [name], [place]   Encounter Date: 09/12/2019  PT End of Session - 09/12/19 1920    Visit Number  4    Number of Visits  12    Date for PT Re-Evaluation  10/12/19    Authorization Type  [facility] focus reporting from 08/31/2019    Authorization - Visit Number  4    Authorization - Number of Visits  10    PT Start Time  1804    PT Stop Time  1845    PT Time Calculation (min)  41 min    Activity Tolerance  Patient tolerated treatment well    Behavior During Therapy  [facility] for tasks assessed/performed       No past medical history on file.  Past Surgical History:  Procedure Laterality Date  . NO PAST SURGERIES      There were no vitals filed for this visit.  Subjective Assessment - 09/12/19 1803    Subjective  Patient reports she is feeling well and has not had any of the R sided paresthesia since saturday in her arm. She still has some pain at the R scapular region. She has still been getting pressure behind her R eye but has not had a severe migraine. Denies any extra pain folloiwng last treatment session. She states her HEP has been going well. She has been trying to do a few every 2 hours while she is sitting at her desk (R rotation and radial nerve glide). She has not had jaw pain the last few days. Currently reports a bit of discomfort in the back of her neck 1/10. She had a school exam on Monday and a quiz yesterday.    Pertinent History  Overall healthy and no past surgeries.    Limitations  Lifting;House hold activities    Patient Stated Goals  Reduce headache and neck pain; to be able to have better sleep    Currently in Pain?  Yes    Pain Score  1     Pain Location  Neck     Pain Orientation  Right        OBJECTIVE:  TREATMENT: Therapeutic exercise:to centralize symptoms and improve ROM, strength, muscular endurance, and activity tolerance required for successful completion of functional activities.  Seated repeated R cervical spine rotation with self overpressure. X 10. Demo good technique and confirmed getting to end range each time without further cuing.   Standing isometric retraction, flexion, and sidebending each side standing at wall pushing small pink theraball into the wall. 5x 5 second each direction. Cuing for improved posture, slight chin tuck for improved muscle activation and endurance.   Wall angels x 10 (noted slight pulling at R scapular region). Cuing to keep contacts with wall.   Education on HEP including handout   Neuromuscular Re-education: to improve, balance, postural strength, muscle activation patterns, and stabilization strength required for functional activities:   Supine deep neck flexor training 2x10 at 10 second hold, with towel roll posterior to occiput. To improve deep flexor motor control, strength, endurance, and cervical postural control.   Manual therapy:to reduce pain and tissue tension, improve range of motion, neuromodulation, in order to promote improved ability to complete functional activities. - supine STM  to posterior cervical musculature with R suboccipital trigger point release.Very tight at R suboccipital region, softened with pressure. Pt states produces concordant pain to R eye, but does not decrease with continued pressure.  - supine joint mobilizations with grade IV overpressure at upper cervical segments L rotation with R side-bending x7 reps (no stretch felt at R); upper cervical segments R rotation L side-bending 2x10 (strong stretch noted at R side), x 10 each at C3, mid cervical segments (diminishing stretch at R side as performed lower). To improve motion and improve effectiveness of HEP.     HOME EXERCISE PROGRAM Access Code: [email or web address]  URL: [email or web address]  Date: 09/12/2019  Prepared by: Rosita Kea   Exercises  Median Nerve Flossing - Tray - 3 sets - 10 reps - 1x daily - 7x weekly  Standing Radial Nerve Glide - 3 sets - 10 reps - 1x daily - 7x weekly  Standing Cervical Rotation AROM with Overpressure - repeated motions - 10-15 reps - 1 second hold - 10-20 every 2 hours  Supine Head Nod with Deep Neck Flexor Activation - 2 sets - 10 reps - 10 seconds hold - 1x daily   Clinical impression: Patient tolerated treatment well and is making progress towards goals at this point. Repeated several interventions from last treatment session due to improvement in symptoms following. Progressed cervical spine and postural strengthening exercises with good tolerance. Patient reported mild discomfort in R scapula following wall angels, but felt this was very tolerable. Pressure to R suboccipital region continues to produce symptoms behind R eye. Plan to continue progressive postural/cervical spine postural training next session to work toward long term pain control/recovery. Recommend considering trigger point dry needling for R suboccipitals for increased relief of symptom. Patient continues to have pain, headaches, and paresthesia that is significantly limiting her function and quality of life. Patient would benefit from continued physical therapy to address remaining impairments and functional limitations to work towards stated goals and return to PLOF or maximal functional independence.     PT Education - 09/12/19 1919    Education Details  Exercise purpose/form. Self management techniques    Person(s) Educated  Patient    Methods  Explanation;Demonstration;Tactile cues;Verbal cues    Comprehension  Verbalized understanding;Returned demonstration       PT Short Term Goals - 09/12/19 1920      PT SHORT TERM GOAL #1   Title  Be independent with initial home  exercise program for self-management of symptoms.    Baseline  initial HEP provided at IE (08/31/2019);    Time  2    Period  Weeks    Status  Achieved    Target Date  09/14/19        PT Long Term Goals - 08/31/19 1308      PT LONG TERM GOAL #1   Title  Be independent with a long-term home exercise program for self-management of symptoms.    Baseline  initial HEP provided at IE (08/31/2019);    Time  6    Period  Weeks    Status  New    Target Date  10/12/19      PT LONG TERM GOAL #2   Title  Demonstrate improved FOTO score by 10 units to demonstrate improvement in overall condition and self-reported functional ability.    Baseline  53 (08/31/2019);    Time  6    Period  Weeks    Status  New    Target Date  10/12/19  PT LONG TERM GOAL #3   Title  Reduce pain with functional activities to equal or less than 1/10 to allow patient to complete usual activities including ADLs, IADLs, and social engagement with less difficulty.    Baseline  2-6/10 wose at night (08/31/2019);    Time  6    Period  Weeks    Status  New    Target Date  10/12/19      PT LONG TERM GOAL #4   Title  Pt will increase strength of by at least 1/2 MMT grade in order to demonstrate improvement in strength and function.    Baseline  See objective notes at IE (08/31/2019);    Time  6    Period  Weeks    Status  New    Target Date  10/12/19      PT LONG TERM GOAL #5   Title  Complete community, work and/or recreational activities (hot yoga) without limitation due to current condition.    Baseline  Unable to participate hot yoga, holding dog leash with R UE(08/31/2019);    Time  6    Period  Weeks    Status  New    Target Date  10/12/19            Plan - 09/12/19 1919    Clinical Impression Statement  Patient tolerated treatment well and is making progress towards goals at this point. Repeated several interventions from last treatment session due to improvement in symptoms following.  Progressed cervical spine and postural strengthening exercises with good tolerance. Patient reported mild discomfort in R scapula following wall angels, but felt this was very tolerable. Pressure to R suboccipital region continues to produce symptoms behind R eye. Plan to continue progressive postural/cervical spine postural training next session to work toward long term pain control/recovery. Recommend considering trigger point dry needling for R suboccipitals for increased relief of symptom. Patient continues to have pain, headaches, and paresthesia that is significantly limiting her function and quality of life. Patient would benefit from continued physical therapy to address remaining impairments and functional limitations to work towards stated goals and return to PLOF or maximal functional independence.    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    Rehab Potential  Good    PT Frequency  2x / week    PT Duration  6 weeks    PT Treatment/Interventions  ADLs/Self Care Home Management;Moist Heat;Cryotherapy;Joint Manipulations;Spinal Manipulations;Manual techniques;Therapeutic exercise;Functional mobility training;Therapeutic activities;Electrical Stimulation;Neuromuscular re-education;Patient/family education;Passive range of motion;Dry needling    PT Next Visit Plan  isometrics, manual, deep neck flexor strengthening, consider needling    PT Home Exercise Plan  Medbridge: [id number]    Consulted and Agree with Plan of Care  Patient       Patient will benefit from skilled therapeutic intervention in order to improve the following deficits and impairments:  Decreased activity tolerance, Decreased endurance, Decreased strength,  Hypermobility, Pain, Impaired UE functional use, Decreased range of motion, Postural dysfunction  Visit Diagnosis: Cervicalgia  Radiculopathy, cervical region  Muscle weakness (generalized)     Problem List There are no active problems to display for this patient.   Nancy Nordmann 09/12/2019, 7:22 PM  [facility] [address], [place], [address]  Phone: [phone]   Fax:  [phone]  Name: Stephanie Russell MRN: [id number] Date of Birth: [birth year]

## 2019-09-14 ENCOUNTER — Other Ambulatory Visit: Payer: Self-pay

## 2019-09-14 ENCOUNTER — Encounter: Payer: Self-pay | Admitting: Physical Therapy

## 2019-09-14 ENCOUNTER — Ambulatory Visit: Payer: No Typology Code available for payment source | Admitting: Physical Therapy

## 2019-09-14 DIAGNOSIS — M542 Cervicalgia: Secondary | ICD-10-CM

## 2019-09-14 DIAGNOSIS — M5412 Radiculopathy, cervical region: Secondary | ICD-10-CM

## 2019-09-14 DIAGNOSIS — M6281 Muscle weakness (generalized): Secondary | ICD-10-CM

## 2019-09-14 NOTE — Therapy (Signed)
[facility] [address] [place], [address] Phone: [phone]   Fax:  [phone]  Physical Therapy Treatment  Patient Details  Name: Stephanie Russell MRN: [id number] Date of Birth: 1990/12/07 Referring Provider (PT): [name], [place]   Encounter Date: 09/14/2019  PT End of Session - 09/14/19 1546    Visit Number  5    Number of Visits  12    Date for PT Re-Evaluation  10/12/19    Authorization Type  [facility] focus reporting from 08/31/2019    Authorization - Visit Number  5    Authorization - Number of Visits  10    PT Start Time  [id number]    PT Stop Time  1615    PT Time Calculation (min)  45 min    Activity Tolerance  Patient tolerated treatment well    Behavior During Therapy  [facility] for tasks assessed/performed       History reviewed. No pertinent past medical history.  Past Surgical History:  Procedure Laterality Date  . NO PAST SURGERIES      There were no vitals filed for this visit.  Subjective Assessment - 09/14/19 1533    Subjective  Patient reports had light sensitivit, headache, jaw pain, and rated as 4/10 today. Patient has been staying in front of computer longer than usual in the past two days. The neck pain started yesterday and became more severe today. Patient felt good after last session.    Pertinent History  Overall healthy and no past surgeries.    Limitations  Lifting;House hold activities    Patient Stated Goals  Reduce headache and neck pain; to be able to have better sleep    Currently in Pain?  Yes    Pain Score  4     Pain Location  Neck       Objective:  Saccade test: normal    TREATMENT: Therapeutic exercise:to centralize symptoms and improve ROM, strength, muscular endurance, and activity tolerance required for successful completion of functional activities.  Seated repeated cervical retraction x 10 reps to increase ROM and improve deep neck flexor  strength.   Standing deep neck flexor strengthening against pink physio ball x 10 reps.   Isometric occipital release and deep neck flexor training x 10 reps. Towel at posterior head. Cuing provided to improve hand and body posture.  Education provided to improve self-care pain control by increasing frequency of breaks when using computer and stretching.   Manual therapy:to reduce pain and tissue tension, improve range of motion, neuromodulation, in order to promote improved ability to complete functional activities. -supine STM to posterior cervical musculature with R suboccipital trigger point release.Very tight at R suboccipital region, softened with pressure. Pt states produces concordant pain to R eye, but does not reduce her headache.  - Education on using tennis ball/soft ball/lacross ball to produce trigger point pain relief at home.   Modalities: (Unbilled) Dry needling performed to glute med on the R suboccipital musculature to decrease pain and spasms along patient's upper neck/base of skull with patient in prone utilizing (1) dry needle(s) .22mm x 5mm. Patient educated about the risks and benefits from therapy and verbally consents to treatment.    HOME EXERCISE PROGRAM Access Code: [email or web address]  URL: [email or web address]  Date: 09/12/2019  Prepared by: Rosita Kea   Exercises  Median Nerve Flossing - Tray - 3 sets - 10 reps - 1x daily - 7x weekly  Standing Radial  Nerve Glide - 3 sets - 10 reps - 1x daily - 7x weekly  Standing Cervical Rotation AROM with Overpressure - repeated motions - 10-15 reps - 1 second hold - 10-20 every 2 hours  Supine Head Nod with Deep Neck Flexor Activation - 2 sets - 10 reps - 10 seconds hold - 1x daily  Clinical impression: Patient tolerated treatment well and achieved reduction of pain at the end session. Patient demonstrated improved ROM, proper muscle activation but still unable to achieve pain control at home. Therefore,  session focused on education for self-care pain control including taking breaks between treatment sessions. Patient continued response well with manual therapy and didn't report significant change with dry needling. From the above presentation and patient subjective statements, patient continues to have pain, headaches, and paresthesia that is significantly limiting her function and quality of life.Patient would benefit from continued physical therapy to address remaining impairments and functional limitations to work towards stated goals and return to PLOF or maximal functional independence.     PT Education - 09/14/19 1545    Education Details  Exercise purpose/form. Self management techniques.    Person(s) Educated  Patient    Methods  Explanation;Demonstration;Tactile cues;Verbal cues    Comprehension  Verbalized understanding;Returned demonstration;Verbal cues required;Tactile cues required       PT Short Term Goals - 09/12/19 1920      PT SHORT TERM GOAL #1   Title  Be independent with initial home exercise program for self-management of symptoms.    Baseline  initial HEP provided at IE (08/31/2019);    Time  2    Period  Weeks    Status  Achieved    Target Date  09/14/19        PT Long Term Goals - 08/31/19 1308      PT LONG TERM GOAL #1   Title  Be independent with a long-term home exercise program for self-management of symptoms.    Baseline  initial HEP provided at IE (08/31/2019);    Time  6    Period  Weeks    Status  New    Target Date  10/12/19      PT LONG TERM GOAL #2   Title  Demonstrate improved FOTO score by 10 units to demonstrate improvement in overall condition and self-reported functional ability.    Baseline  53 (08/31/2019);    Time  6    Period  Weeks    Status  New    Target Date  10/12/19      PT LONG TERM GOAL #3   Title  Reduce pain with functional activities to equal or less than 1/10 to allow patient to complete usual activities including  ADLs, IADLs, and social engagement with less difficulty.    Baseline  2-6/10 wose at night (08/31/2019);    Time  6    Period  Weeks    Status  New    Target Date  10/12/19      PT LONG TERM GOAL #4   Title  Pt will increase strength of by at least 1/2 MMT grade in order to demonstrate improvement in strength and function.    Baseline  See objective notes at IE (08/31/2019);    Time  6    Period  Weeks    Status  New    Target Date  10/12/19      PT LONG TERM GOAL #5   Title  Complete community, work and/or recreational activities (  hot yoga) without limitation due to current condition.    Baseline  Unable to participate hot yoga, holding dog leash with R UE(08/31/2019);    Time  6    Period  Weeks    Status  New    Target Date  10/12/19            Plan - 09/14/19 1647    Clinical Impression Statement  Patient tolerated treatment well and achieved reduction of pain at the end session. Patient demonstrated improved ROM, proper muscle activation but still unable to achieve pain control at home. Therefore, session focused on education for self-care pain control including taking breaks between treatment sessions. Patient continued response well with manual therapy and didn't report significant change with dry needling. From the above presentation and patient subjective statements, patient continues to have pain, headaches, and paresthesia that is significantly limiting her function and quality of life. Patient would benefit from continued physical therapy to address remaining impairments and functional limitations to work towards stated goals and return to PLOF or maximal functional independence.    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/Moderate complexity    Rehab Potential  Good    PT Frequency  2x / week    PT Duration  6 weeks    PT Treatment/Interventions  ADLs/Self Care Home Management;Moist Heat;Cryotherapy;Joint Manipulations;Spinal Manipulations;Manual techniques;Therapeutic exercise;Functional mobility training;Therapeutic activities;Electrical Stimulation;Neuromuscular re-education;Patient/family education;Passive range of motion;Dry needling    PT Next Visit Plan  isometrics, manual, deep neck flexor strengthening, consider needling    PT Home Exercise Plan  Medbridge: [id number]    Consulted and Agree with Plan of Care  Patient       Patient will benefit from skilled therapeutic intervention in order to improve the following deficits and impairments:  Decreased activity tolerance, Decreased endurance, Decreased strength, Hypermobility, Pain, Impaired UE functional use, Decreased range of motion, Postural dysfunction  Visit Diagnosis: Cervicalgia  Radiculopathy, cervical region  Muscle weakness (generalized)     Problem List There are no active problems to display for this patient.   Sherrilyn Rist, SPT 09/14/19, 6:58 PM  Everlean Alstrom. Graylon Good, PT, DPT 09/14/19, 7:03 PM   [facility] [address], [place], [address] Phone: [phone]   Fax:  [phone]  Name: Stephanie Russell MRN: [id number] Date of Birth: 1991/01/17

## 2019-09-15 NOTE — Telephone Encounter (Signed)
Pt's husband called saying the pharmacy told them they received the Pa but it is missing info regarding pt's prior [facility] medication.  [facility] [employer] faxed over the new PA to be completed.  CB# [phone]  [place]

## 2019-09-19 ENCOUNTER — Telehealth: Payer: Self-pay

## 2019-09-19 ENCOUNTER — Other Ambulatory Visit: Payer: Self-pay

## 2019-09-19 ENCOUNTER — Encounter: Payer: Self-pay | Admitting: Physician Assistant

## 2019-09-19 ENCOUNTER — Encounter: Payer: Self-pay | Admitting: Physical Therapy

## 2019-09-19 ENCOUNTER — Ambulatory Visit: Payer: No Typology Code available for payment source | Attending: Physician Assistant | Admitting: Physical Therapy

## 2019-09-19 DIAGNOSIS — M542 Cervicalgia: Secondary | ICD-10-CM | POA: Diagnosis not present

## 2019-09-19 DIAGNOSIS — M5412 Radiculopathy, cervical region: Secondary | ICD-10-CM | POA: Diagnosis present

## 2019-09-19 DIAGNOSIS — M6281 Muscle weakness (generalized): Secondary | ICD-10-CM | POA: Insufficient documentation

## 2019-09-19 MED FILL — LO LOESTRIN FE 1-10 TABLET: 1 MG-10 MCG | 84 days supply | Qty: 84 | Fill #0

## 2019-09-19 NOTE — Therapy (Signed)
[facility] [address], [place], [address] Phone: [phone]   Fax:  [phone]  Physical Therapy Treatment  Patient Details  Name: Stephanie Russell MRN: [id number] Date of Birth: [birth year] Referring Provider (PT): [name], [place]   Encounter Date: 09/19/2019  PT End of Session - 09/19/19 1345    Visit Number  6    Number of Visits  12    Date for PT Re-Evaluation  10/12/19    Authorization Type  [employer] focus reporting from 08/31/2019    Authorization - Visit Number  6    Authorization - Number of Visits  10    PT Start Time  [id number]    PT Stop Time  1430    PT Time Calculation (min)  44 min    Activity Tolerance  Patient tolerated treatment well    Behavior During Therapy  [facility] for tasks assessed/performed       History reviewed. No pertinent past medical history.  Past Surgical History:  Procedure Laterality Date  . NO PAST SURGERIES      There were no vitals filed for this visit.  Subjective Assessment - 09/19/19 1345    Subjective  Patient states she had zero pain today and felt pretty good. She has been doing her exercises regularly and bought the tennis balls.    Pertinent History  Overall healthy and no past surgeries.    Limitations  Lifting;House hold activities    Patient Stated Goals  Reduce headache and neck pain; to be able to have better sleep    Currently in Pain?  No/denies          TREATMENT: Therapeutic exercise:to centralize symptoms and improve ROM, strength, muscular endurance, and activity tolerance required for successful completion of functional activities. - Upper body ergometer with no added resistance encourage joint nutrition, warm tissue, induce analgesic effect of aerobic exercise, improve muscular strength and endurance,  and prepare for remainder of session 5 mins. - UE strengthening exercises with therapy band (red) 4s hold each  - Shoulder row  2x10 reps  - Shoulder flexion 2x10 reps  - Shoulder external rotation 2x10 reps  - Shoulder PNF D2 2x10 reps Cuing provided to improve core stability, forms, techniques. Patient able to teach back at the end session.  - Physioball prone back extension x 10 reps Cuing provided to improve core stability, forms and muscle activation patterns.    HOME EXERCISE PROGRAM Access Code: [email or web address]  URL: [email or web address]  Date: 09/12/2019  Prepared by: Rosita Kea   Exercises  Median Nerve Flossing - Tray - 3 sets - 10 reps - 1x daily - 7x weekly  Standing Radial Nerve Glide - 3 sets - 10 reps - 1x daily - 7x weekly  Standing Cervical Rotation AROM with Overpressure - repeated motions - 10-15 reps - 1 second hold - 10-20 every 2 hours  Supine Head Nod with Deep Neck Flexor Activation - 2 sets - 10 reps - 10 seconds hold - 1x daily  Clinical impression: Patient tolerated treatment well and continue progress towards her goals. Patient demonstrated good forms and techniques with cuing and able to teach back at the end session. Patient presented lack of core stability and back extensor muscles with physio ball exercises and will continue progress at later session. Patient would benefit from continued physical therapy to address remaining impairments and functional limitations to work towards stated goals and return to PLOF or maximal functional  independence.     PT Education - 09/19/19 1345    Education Details  Exercise purpose/form. Self management techniques.    Person(s) Educated  Patient    Methods  Explanation;Demonstration;Tactile cues;Verbal cues    Comprehension  Verbalized understanding;Returned demonstration;Verbal cues required;Tactile cues required       PT Short Term Goals - 09/12/19 1920      PT SHORT TERM GOAL #1   Title  Be independent with initial home exercise program for self-management of symptoms.    Baseline  initial HEP provided at IE  (08/31/2019);    Time  2    Period  Weeks    Status  Achieved    Target Date  09/14/19        PT Long Term Goals - 08/31/19 1308      PT LONG TERM GOAL #1   Title  Be independent with a long-term home exercise program for self-management of symptoms.    Baseline  initial HEP provided at IE (08/31/2019);    Time  6    Period  Weeks    Status  New    Target Date  10/12/19      PT LONG TERM GOAL #2   Title  Demonstrate improved FOTO score by 10 units to demonstrate improvement in overall condition and self-reported functional ability.    Baseline  53 (08/31/2019);    Time  6    Period  Weeks    Status  New    Target Date  10/12/19      PT LONG TERM GOAL #3   Title  Reduce pain with functional activities to equal or less than 1/10 to allow patient to complete usual activities including ADLs, IADLs, and social engagement with less difficulty.    Baseline  2-6/10 wose at night (08/31/2019);    Time  6    Period  Weeks    Status  New    Target Date  10/12/19      PT LONG TERM GOAL #4   Title  Pt will increase strength of by at least 1/2 MMT grade in order to demonstrate improvement in strength and function.    Baseline  See objective notes at IE (08/31/2019);    Time  6    Period  Weeks    Status  New    Target Date  10/12/19      PT LONG TERM GOAL #5   Title  Complete community, work and/or recreational activities (hot yoga) without limitation due to current condition.    Baseline  Unable to participate hot yoga, holding dog leash with R UE(08/31/2019);    Time  6    Period  Weeks    Status  New    Target Date  10/12/19            Plan - 09/19/19 1346    Clinical Impression Statement  Patient tolerated treatment well and continue progress towards her goals. Patient demonstrated good forms and techniques with cuing and able to teach back at the end session. Patient presented lack of core stability and back extensor muscles with physio ball exercises and will  continue progress at later session. Patient would benefit from continued physical therapy to address remaining impairments and functional limitations to work towards stated goals and return to PLOF or maximal functional independence.    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    Rehab Potential  Good    PT Frequency  2x / week    PT Duration  6 weeks    PT Treatment/Interventions  ADLs/Self Care Home Management;Moist Heat;Cryotherapy;Joint Manipulations;Spinal Manipulations;Manual techniques;Therapeutic exercise;Functional mobility training;Therapeutic activities;Electrical Stimulation;Neuromuscular re-education;Patient/family education;Passive range of motion;Dry needling    PT Next Visit Plan  isometrics, manual, deep neck flexor strengthening, consider needling    PT Home Exercise Plan  Medbridge: [id number]    Consulted and Agree with Plan of Care  Patient       Patient will benefit from skilled therapeutic intervention in order to improve the following deficits and impairments:  Decreased activity tolerance, Decreased endurance, Decreased strength, Hypermobility, Pain, Impaired UE functional use, Decreased range of motion, Postural dysfunction  Visit Diagnosis: Cervicalgia  Radiculopathy, cervical region  Muscle weakness (generalized)     Problem List There are no active problems to display for this patient.   Sherrilyn Rist, SPT 09/19/19, 6:17 PM  Everlean Alstrom. Graylon Good, PT, DPT 09/19/19, 6:24 PM    [facility] [address] [place], [address] Phone: [phone]   Fax:  [phone]  Name: Stephanie Russell MRN: [id number] Date of Birth:  05-29-1991

## 2019-09-19 NOTE — Telephone Encounter (Signed)
-----   Message from Truitt Merle sent at 09/19/2019  1:41 PM EST ----- Review incoming fax

## 2019-09-19 NOTE — Telephone Encounter (Signed)
PA approved for Lo loestrin fe. [place] [facility] advised. Left message on vm for patient.

## 2019-09-21 ENCOUNTER — Other Ambulatory Visit: Payer: Self-pay

## 2019-09-21 ENCOUNTER — Encounter: Payer: Self-pay | Admitting: Physical Therapy

## 2019-09-21 ENCOUNTER — Ambulatory Visit: Payer: No Typology Code available for payment source | Admitting: Physical Therapy

## 2019-09-21 DIAGNOSIS — M542 Cervicalgia: Secondary | ICD-10-CM | POA: Diagnosis not present

## 2019-09-21 DIAGNOSIS — M6281 Muscle weakness (generalized): Secondary | ICD-10-CM

## 2019-09-21 DIAGNOSIS — M5412 Radiculopathy, cervical region: Secondary | ICD-10-CM

## 2019-09-21 NOTE — Therapy (Signed)
[place] [facility] [address], [place], [address] Phone: [phone]   Fax:  [phone]  Physical Therapy Treatment  Patient Details  Name: Stephanie Russell MRN: [id number] Date of Birth: 10-17-1991 Referring Provider (PT): [name], [place]   Encounter Date: 09/21/2019  PT End of Session - 09/21/19 1122    Visit Number  7    Number of Visits  12    Date for PT Re-Evaluation  10/12/19    Authorization Type  [employer] focus reporting from 08/31/2019    Authorization - Visit Number  7    Authorization - Number of Visits  10    PT Start Time  1117    PT Stop Time  1202    PT Time Calculation (min)  45 min    Activity Tolerance  Patient tolerated treatment well    Behavior During Therapy  [facility] for tasks assessed/performed       History reviewed. No pertinent past medical history.  Past Surgical History:  Procedure Laterality Date  . NO PAST SURGERIES      There were no vitals filed for this visit.  Subjective Assessment - 09/21/19 1117    Subjective  Patient states she had zero pain today and only a minor discomfort at lower scapula.    Pertinent History  Overall healthy and no past surgeries.    Limitations  Lifting;House hold activities    Patient Stated Goals  Reduce headache and neck pain; to be able to have better sleep    Currently in Pain?  No/denies        TREATMENT: Therapeutic exercise:to centralize symptoms and improve ROM, strength, muscular endurance, and activity tolerance required for successful completion of functional activities. - Upper body ergometerwith no added resistanceencourage joint nutrition, warm tissue, induce analgesic effect of aerobic exercise, improve muscular strength and endurance, and prepare for remainder of session5 mins. - UE strengthening exercises with therapy band (red) 4s hold each             - Shoulder row 1x10 reps             - Shoulder flexion 1x10  reps             - Shoulder external rotation 1x10 reps             - Shoulder PNF D2 1x10 reps each side  - Shoulder abduction 1x10 reps  Cuing provided to improve core stability, forms, techniques. Patient able to teach back at the end session.   - Shoulder blade exercises to improve shoulder stability.   - Shoulder 45 degrees abduction 3x 15s each side   - Shoulder 90 degrees abduction 3x 15s each side   - Shoulder PNF D2 patterns 3x5 each side - Patient was educated on diagnosis, anatomy and pathology involved, prognosis, role of PT, and was given an HEP, demonstrating exercise with proper form following verbal and tactile cues. Pt was educated on and agreed to plan of care.  HOME EXERCISE PROGRAM Access Code: [email or web address]  URL: [email or web address]  Date: 09/12/2019  Prepared by: Rosita Kea   Exercises  Median Nerve Flossing - Tray - 3 sets - 10 reps - 1x daily - 7x weekly  Standing Radial Nerve Glide - 3 sets - 10 reps - 1x daily - 7x weekly  Standing Cervical Rotation AROM with Overpressure - repeated motions - 10-15 reps - 1 second hold - 10-20 every 2 hours  Supine Head Nod with Deep Neck Flexor Activation - 2 sets - 10 reps - 10 seconds hold - 1x daily  Clinical impression: Patient tolerated treatment well andcontinue progress towards her goals. Patient demonstrated improve pain control and increased activity tolerance. Patient also presented positive attitude towards her therapy session. Patient continued demonstrating general weakness at [employer]. Future session will focus on strength, and functional activity and postural training. Patient would benefit from continued physical therapy to address remaining impairments and functional limitations to work towards stated goals and return to PLOF or maximal functional independence.    PT Education - 09/21/19 1121    Education Details  Exercise purpose/form. Self management techniques.    Person(s) Educated  Patient     Methods  Explanation;Demonstration;Tactile cues;Verbal cues    Comprehension  Returned demonstration;Verbalized understanding;Verbal cues required;Tactile cues required       PT Short Term Goals - 09/12/19 1920      PT SHORT TERM GOAL #1   Title  Be independent with initial home exercise program for self-management of symptoms.    Baseline  initial HEP provided at IE (08/31/2019);    Time  2    Period  Weeks    Status  Achieved    Target Date  09/14/19        PT Long Term Goals - 08/31/19 1308      PT LONG TERM GOAL #1   Title  Be independent with a long-term home exercise program for self-management of symptoms.    Baseline  initial HEP provided at IE (08/31/2019);    Time  6    Period  Weeks    Status  New    Target Date  10/12/19      PT LONG TERM GOAL #2   Title  Demonstrate improved FOTO score by 10 units to demonstrate improvement in overall condition and self-reported functional ability.    Baseline  53 (08/31/2019);    Time  6    Period  Weeks    Status  New    Target Date  10/12/19      PT LONG TERM GOAL #3   Title  Reduce pain with functional activities to equal or less than 1/10 to allow patient to complete usual activities including ADLs, IADLs, and social engagement with less difficulty.    Baseline  2-6/10 wose at night (08/31/2019);    Time  6    Period  Weeks    Status  New    Target Date  10/12/19      PT LONG TERM GOAL #4   Title  Pt will increase strength of by at least 1/2 MMT grade in order to demonstrate improvement in strength and function.    Baseline  See objective notes at IE (08/31/2019);    Time  6    Period  Weeks    Status  New    Target Date  10/12/19      PT LONG TERM GOAL #5   Title  Complete community, work and/or recreational activities (hot yoga) without limitation due to current condition.    Baseline  Unable to participate hot yoga, holding dog leash with R UE(08/31/2019);    Time  6    Period  Weeks    Status  New     Target Date  10/12/19            Plan - 09/21/19 1246    Clinical Impression Statement  Patient tolerated treatment well and  continue progress towards her goals. Patient demonstrated improve pain control and increased activity tolerance. Patient also presented positive attitude towards her therapy session. Patient continued demonstrating general weakness at [place]. Future session will focus on strength, and functional activity and postural training. Patient would benefit from continued physical therapy to address remaining impairments and functional limitations to work towards stated goals and return to PLOF or maximal functional independence.    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    Rehab Potential  Good    PT Frequency  2x / week    PT Duration  6 weeks    PT Treatment/Interventions  ADLs/Self Care Home Management;Moist Heat;Cryotherapy;Joint Manipulations;Spinal Manipulations;Manual techniques;Therapeutic exercise;Functional mobility training;Therapeutic activities;Electrical Stimulation;Neuromuscular re-education;Patient/family education;Passive range of motion;Dry needling    PT Next Visit Plan  isometrics, manual, deep neck flexor strengthening, consider needling    PT Home Exercise Plan  Medbridge: [id number]    Consulted and Agree with Plan of Care  Patient       Patient will benefit from skilled therapeutic intervention in order to improve the following deficits and impairments:  Decreased activity tolerance, Decreased endurance, Decreased strength, Hypermobility, Pain, Impaired UE functional use, Decreased range of motion, Postural dysfunction  Visit Diagnosis: Cervicalgia  Radiculopathy, cervical  region  Muscle weakness (generalized)     Problem List There are no active problems to display for this patient.   Sherrilyn Rist, SPT 09/21/19, 2:53 PM  Everlean Alstrom. Graylon Good, PT, DPT 09/21/19, 3:08 PM  [facility] [address] [place], [address] Phone: [phone]   Fax:  [phone]  Name: Stephanie Russell MRN: [id number] Date of Birth: 1991/03/27

## 2019-09-26 ENCOUNTER — Other Ambulatory Visit: Payer: Self-pay

## 2019-09-26 ENCOUNTER — Ambulatory Visit: Payer: No Typology Code available for payment source

## 2019-09-26 ENCOUNTER — Encounter: Payer: Self-pay | Admitting: Physical Therapy

## 2019-09-26 DIAGNOSIS — M542 Cervicalgia: Secondary | ICD-10-CM | POA: Diagnosis not present

## 2019-09-26 DIAGNOSIS — M6281 Muscle weakness (generalized): Secondary | ICD-10-CM

## 2019-09-26 DIAGNOSIS — M5412 Radiculopathy, cervical region: Secondary | ICD-10-CM

## 2019-09-27 NOTE — Therapy (Signed)
[facility] [address] [place], [address] Phone: [phone]   Fax:  [phone]  Physical Therapy Treatment  Patient Details  Name: Stephanie Russell MRN: [id number] Date of Birth: 17-Feb-1991 Referring Provider (PT): [name], [place]   Encounter Date: 09/26/2019  PT End of Session - 09/26/19 1400    Visit Number  8    Number of Visits  12    Date for PT Re-Evaluation  10/12/19    Authorization Type  [facility] focus reporting from 08/31/2019    Authorization - Visit Number  8    Authorization - Number of Visits  10    PT Start Time  [id number]    PT Stop Time  1432    PT Time Calculation (min)  44 min    Activity Tolerance  Patient tolerated treatment well    Behavior During Therapy  [facility] for tasks assessed/performed       History reviewed. No pertinent past medical history.  Past Surgical History:  Procedure Laterality Date  . NO PAST SURGERIES      There were no vitals filed for this visit.  Subjective Assessment - 09/26/19 1355    Subjective  Patient states she had 2-3 pain today at posterior R neck, jaw, and shoulder but not traveling to the arms. Patient woke up today at 4am from this pain with intensity of 5/10. Patient has tried stretching, tennis ball, and all exercises at home but no symptom relief.    Pertinent History  Overall healthy and no past surgeries.    Limitations  Lifting;House hold activities    Patient Stated Goals  Reduce headache and neck pain; to be able to have better sleep    Currently in Pain?  Yes    Pain Score  2     Pain Location  Neck    Pain Orientation  Right       TREATMENT: Therapeutic exercise:to centralize symptoms and improve ROM, strength, muscular endurance, and activity tolerance required for successful completion of functional activities. - Yoga poses analysis with planks, press up, downward facing dogs. Cuing provided to improve looking down and  deep neck flexor activation.  - planks on elbow x 5 reps. Cuing provided to improve deep neck flexor activation and relaxation of posterior neck musculature relaxation.  - Supine deep neck flexor activation and head flexion x15 reps.  - Patient was educated on diagnosis, anatomy and pathology involved, prognosis, role of PT, and was given an HEP, demonstrating exercise with proper form following verbal and tactile cues. Pt was educated on and agreed to plan of care.  - Education also provided on proper muscle activation during patient's yoga practice with keeping head in neutral at chin tuck.    Manual therapy: to reduce pain and tissue tension, improve range of motion, neuromodulation, in order to promote improved ability to complete functional activities. - STM at R posterior neck muscle, R occipital muscles' trigger points, upper traps, manual traction for pain control.    HOME EXERCISE PROGRAM Access Code: [email or web address]  URL: [email or web address]  Date: 09/12/2019  Prepared by: Rosita Kea   Exercises  Median Nerve Flossing - Tray - 3 sets - 10 reps - 1x daily - 7x weekly  Standing Radial Nerve Glide - 3 sets - 10 reps - 1x daily - 7x weekly  Standing Cervical Rotation AROM with Overpressure - repeated motions - 10-15 reps - 1 second hold - 10-20 every 2  hours  Supine Head Nod with Deep Neck Flexor Activation - 2 sets - 10 reps - 10 seconds hold - 1x daily  Clinical impression: Patient tolerated treatment well andcontinue progress towards her goals. Patient demonstrated strong activation of her posterior neck extensors during assessed yoga poses. Education provided to improve deep neck flexor activation as well as relaxation of posterior neck extensors. Patient demonstrated good understanding of instructions but still requires moderate cuing to maintain proper forms. Future session will focus on pain control, strength, and functional activity and postural training. Patient  would benefit from continued physical therapy to address remaining impairments and functional limitations to work towards stated goals and return to PLOF or maximal functional independence.     PT Education - 09/26/19 1359    Education Details  with blue therapy band at ankle    Person(s) Educated  Patient    Methods  Explanation;Demonstration;Tactile cues;Verbal cues    Comprehension  Verbalized understanding;Returned demonstration;Verbal cues required;Tactile cues required       PT Short Term Goals - 09/12/19 1920      PT SHORT TERM GOAL #1   Title  Be independent with initial home exercise program for self-management of symptoms.    Baseline  initial HEP provided at IE (08/31/2019);    Time  2    Period  Weeks    Status  Achieved    Target Date  09/14/19        PT Long Term Goals - 08/31/19 1308      PT LONG TERM GOAL #1   Title  Be independent with a long-term home exercise program for self-management of symptoms.    Baseline  initial HEP provided at IE (08/31/2019);    Time  6    Period  Weeks    Status  New    Target Date  10/12/19      PT LONG TERM GOAL #2   Title  Demonstrate improved FOTO score by 10 units to demonstrate improvement in overall condition and self-reported functional ability.    Baseline  53 (08/31/2019);    Time  6    Period  Weeks    Status  New    Target Date  10/12/19      PT LONG TERM GOAL #3   Title  Reduce pain with functional activities to equal or less than 1/10 to allow patient to complete usual activities including ADLs, IADLs, and social engagement with less difficulty.    Baseline  2-6/10 wose at night (08/31/2019);    Time  6    Period  Weeks    Status  New    Target Date  10/12/19      PT LONG TERM GOAL #4   Title  Pt will increase strength of by at least 1/2 MMT grade in order to demonstrate improvement in strength and function.    Baseline  See objective notes at IE (08/31/2019);    Time  6    Period  Weeks    Status   New    Target Date  10/12/19      PT LONG TERM GOAL #5   Title  Complete community, work and/or recreational activities (hot yoga) without limitation due to current condition.    Baseline  Unable to participate hot yoga, holding dog leash with R UE(08/31/2019);    Time  6    Period  Weeks    Status  New    Target Date  10/12/19  Plan - 09/27/19 0906    Clinical Impression Statement  Patient tolerated treatment well and continue progress towards her goals. Patient demonstrated strong activation of her posterior neck extensors during assessed yoga poses. Education provided to improve deep neck flexor activation as well as relaxation of posterior neck extensors. Patient demonstrated good understanding of instructions but still requires moderate cuing to maintain proper forms. Future session will focus on pain control, strength, and functional activity and postural training. 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    Rehab Potential  Good    PT Frequency  2x / week    PT Duration  6 weeks    PT Treatment/Interventions  ADLs/Self Care Home Management;Moist Heat;Cryotherapy;Joint Manipulations;Spinal Manipulations;Manual techniques;Therapeutic exercise;Functional mobility training;Therapeutic activities;Electrical Stimulation;Neuromuscular re-education;Patient/family education;Passive range of motion;Dry needling    PT Next Visit Plan  isometrics, manual, deep neck flexor strengthening, consider needling    PT Home  Exercise Plan  Medbridge: [id number]    Consulted and Agree with Plan of Care  Patient       Patient will benefit from skilled therapeutic intervention in order to improve the following deficits and impairments:  Decreased activity tolerance, Decreased endurance, Decreased strength, Hypermobility, Pain, Impaired UE functional use, Decreased range of motion, Postural dysfunction  Visit Diagnosis: Cervicalgia  Radiculopathy, cervical region  Muscle weakness (generalized)     Problem List There are no active problems to display for this patient.  Sherrilyn Rist, SPT 09/27/19, 9:22 AM  Estimated Units: 3  Therapeutic Activity:  min  Therapeutic Exercise: 20 min Manual: 20 min  [facility] [address] [place], [address] Phone: [phone]   Fax:  [phone]  Name: Naavya Manchester MRN: [id number] Date of Birth: 05-Jul-1991

## 2019-09-28 ENCOUNTER — Encounter: Payer: Self-pay | Admitting: Physical Therapy

## 2019-09-28 ENCOUNTER — Encounter: Payer: No Typology Code available for payment source | Admitting: Physical Therapy

## 2019-09-28 ENCOUNTER — Ambulatory Visit: Payer: No Typology Code available for payment source | Admitting: Physical Therapy

## 2019-09-28 ENCOUNTER — Other Ambulatory Visit: Payer: Self-pay

## 2019-09-28 DIAGNOSIS — M542 Cervicalgia: Secondary | ICD-10-CM

## 2019-09-28 DIAGNOSIS — M5412 Radiculopathy, cervical region: Secondary | ICD-10-CM

## 2019-09-28 DIAGNOSIS — M6281 Muscle weakness (generalized): Secondary | ICD-10-CM

## 2019-09-28 NOTE — Therapy (Signed)
[facility] [address], [place], [address] Phone: [phone]   Fax:  [phone]  Physical Therapy Treatment  Patient Details  Name: Stephanie Russell MRN: [id number] Date of Birth: 1991-05-14 Referring Provider (PT): [name], [place]   Encounter Date: 09/28/2019  PT End of Session - 09/28/19 1410    Visit Number  9    Number of Visits  12    Date for PT Re-Evaluation  10/12/19    Authorization Type  [facility] focus reporting from 08/31/2019    Authorization - Visit Number  9    Authorization - Number of Visits  10    PT Start Time  1301    PT Stop Time  1351    PT Time Calculation (min)  50 min    Activity Tolerance  Patient tolerated treatment well    Behavior During Therapy  [facility] for tasks assessed/performed       History reviewed. No pertinent past medical history.  Past Surgical History:  Procedure Laterality Date  . NO PAST SURGERIES      There were no vitals filed for this visit.  Subjective Assessment - 09/28/19 1310    Subjective  Patient states she felt somewhat relief after last session on Tuesday and patient had significant cervical pain and headache starting this Wednesday which she cannot study and function under the pain. Patient states she feels better today but still have irritation at R posterior neck and jaw areas.    Pertinent History  Overall healthy and no past surgeries.    Limitations  Lifting;House hold activities    Patient Stated Goals  Reduce headache and neck pain; to be able to have better sleep    Currently in Pain?  Yes    Pain Score  3        TREATMENT: Therapeutic exercise:to centralize symptoms and improve ROM, strength, muscular endurance, and activity tolerance required for successful completion of functional activities. -UE strengthening exercises prone on treatment table - Shoulder "I" extension 3x10 reps - Shoulder  "Y"flexion3x10 reps - Shoulder "T" horizontal abduction3x10 reps  Cuing provided to improve forms, intensity, reps, techniques and deep neck flexor activation and cervical extensors relaxation.  - Supine cervical flexion 2x10 reps with towel rolls under neck. Cuing to chin tuck.  - Education provided with postural training during transfers throughout the session and cuing to relax cervical extensors.  - Patient was educated on diagnosis, anatomy and pathology involved, prognosis, role of PT, and was given an updated HEP, demonstrating exercise with proper form following verbal and tactile cues. Pt was educated on and agreed to plan of care.  Modalities: (Unbilled) Dry needling performed to glute med on the R suboccipital musculature to decrease pain and spasms along patient's upper neck/base of skull with patient in prone utilizing (1) dry needle(s) .43mm x 43mm. Patient educated about the risks and benefits from therapy and verbally consents to treatment.  HOME EXERCISE PROGRAM Access Code: [email or web address]  URL: [email or web address]  Date: 09/28/2019  Prepared by: Rosita Kea   Exercises Median Nerve Flossing - Tray - 3 sets - 10 reps - 1x daily - 7x weekly Standing Radial Nerve Glide - 3 sets - 10 reps - 1x daily - 7x weekly Standing Cervical Rotation AROM with Overpressure - repeated motions - 10-15 reps - 1 second hold - 10-20 every 2 hours Shoulder extension with resistance - Neutral - 3 sets - 10 reps Shoulder PNF D2  with Resistance - 3 sets - 10 reps Standing Shoulder External Rotation with Resistance - 3 sets - 10 reps Standing Bilateral Low Shoulder Row with Anchored Resistance - 3 sets - 10 reps Supine Head Nod with Deep Neck Flexor Activation - 2 sets - 10 reps - 10 seconds hold - 1x daily Prone Single Arm Shoulder Y - 2-3 sets - 10 reps Prone Shoulder Horizontal Abduction - 2-3 sets - 10 reps Prone Shoulder Extension - Single Arm - 2-3 sets - 10  reps  Clinical impression: Patient tolerated treatment well andcontinue progress towards her goals. Patient demonstrated good understanding of proper forms during transfers throughout daily activity. Patient also presents good attitude towards her therapy outcomes. Patient continue demonstrating significant cervical pain which impair her ADLs, life and study. Future session will focus on strength, and functional activity and postural training. Patient would benefit from continued physical therapy to address remaining impairments and functional limitations to work towards stated goals and return to PLOF or maximal functional independence.      PT Education - 09/28/19 1409    Education Details  Exercise purpose/form. Self management techniques.    Person(s) Educated  Patient    Methods  Explanation;Demonstration;Tactile cues;Verbal cues    Comprehension  Verbalized understanding;Returned demonstration;Verbal cues required;Tactile cues required       PT Short Term Goals - 09/12/19 1920      PT SHORT TERM GOAL #1   Title  Be independent with initial home exercise program for self-management of symptoms.    Baseline  initial HEP provided at IE (08/31/2019);    Time  2    Period  Weeks    Status  Achieved    Target Date  09/14/19        PT Long Term Goals - 08/31/19 1308      PT LONG TERM GOAL #1   Title  Be independent with a long-term home exercise program for self-management of symptoms.    Baseline  initial HEP provided at IE (08/31/2019);    Time  6    Period  Weeks    Status  New    Target Date  10/12/19      PT LONG TERM GOAL #2   Title  Demonstrate improved FOTO score by 10 units to demonstrate improvement in overall condition and self-reported functional ability.    Baseline  53 (08/31/2019);    Time  6    Period  Weeks    Status  New    Target Date  10/12/19      PT LONG TERM GOAL #3   Title  Reduce pain with functional activities to equal or less than 1/10 to  allow patient to complete usual activities including ADLs, IADLs, and social engagement with less difficulty.    Baseline  2-6/10 wose at night (08/31/2019);    Time  6    Period  Weeks    Status  New    Target Date  10/12/19      PT LONG TERM GOAL #4   Title  Pt will increase strength of by at least 1/2 MMT grade in order to demonstrate improvement in strength and function.    Baseline  See objective notes at IE (08/31/2019);    Time  6    Period  Weeks    Status  New    Target Date  10/12/19      PT LONG TERM GOAL #5   Title  Complete community, work and/or  recreational activities (hot yoga) without limitation due to current condition.    Baseline  Unable to participate hot yoga, holding dog leash with R UE(08/31/2019);    Time  6    Period  Weeks    Status  New    Target Date  10/12/19            Plan - 09/28/19 1411    Clinical Impression Statement  Patient tolerated treatment well and continue progress towards her goals. Patient demonstrated good understanding of proper forms during transfers throughout daily activity. Patient also presents good attitude towards her therapy outcomes. Patient continue demonstrating significant cervical pain which impair her ADLs, life and study. Future session will focus on strength, and functional activity and postural training. Patient would benefit from continued physical therapy to address remaining impairments and functional limitations to work towards stated goals and return to PLOF or maximal functional independence.    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    Rehab Potential  Good    PT Frequency  2x / week    PT Duration  6 weeks    PT Treatment/Interventions   ADLs/Self Care Home Management;Moist Heat;Cryotherapy;Joint Manipulations;Spinal Manipulations;Manual techniques;Therapeutic exercise;Functional mobility training;Therapeutic activities;Electrical Stimulation;Neuromuscular re-education;Patient/family education;Passive range of motion;Dry needling    PT Next Visit Plan  isometrics, manual, deep neck flexor strengthening, consider needling    PT Home Exercise Plan  Medbridge: [id number]    Consulted and Agree with Plan of Care  Patient       Patient will benefit from skilled therapeutic intervention in order to improve the following deficits and impairments:  Decreased activity tolerance, Decreased endurance, Decreased strength, Hypermobility, Pain, Impaired UE functional use, Decreased range of motion, Postural dysfunction  Visit Diagnosis: Cervicalgia  Radiculopathy, cervical region  Muscle weakness (generalized)     Problem List There are no active problems to display for this patient.   Sherrilyn Rist, SPT 09/28/19, 2:12 PM  Everlean Alstrom. Graylon Good, PT, DPT 09/28/19, 6:49 PM  [facility] [address] [place], [address] Phone: [phone]   Fax:  [phone]  Name: Stephanie Russell MRN: [id number] Date of Birth: 13-Mar-1991

## 2019-10-02 ENCOUNTER — Encounter: Payer: Self-pay | Admitting: Physical Therapy

## 2019-10-02 ENCOUNTER — Ambulatory Visit: Payer: No Typology Code available for payment source | Admitting: Physical Therapy

## 2019-10-02 ENCOUNTER — Other Ambulatory Visit: Payer: Self-pay

## 2019-10-02 DIAGNOSIS — M542 Cervicalgia: Secondary | ICD-10-CM

## 2019-10-02 DIAGNOSIS — M6281 Muscle weakness (generalized): Secondary | ICD-10-CM

## 2019-10-02 DIAGNOSIS — M5412 Radiculopathy, cervical region: Secondary | ICD-10-CM

## 2019-10-02 NOTE — Therapy (Signed)
[facility] [address], [place], [address] Phone: [phone]   Fax:  [phone]  Physical Therapy Treatment/ Progress notes/Re-certification  Reporting period from 08/31/2019 to 10/02/2019  Patient Details  Name: Stephanie Russell MRN: [id number] Date of Birth: December 19, 1990 Referring Provider (PT): [name], [place]   Encounter Date: 10/02/2019  PT End of Session - 10/02/19 1524    Visit Number  10    Number of Visits  22    Date for PT Re-Evaluation  11/13/19    Authorization Type  [employer] focus reporting from 08/31/2019 (new reporting period from 10/02/2019)    Authorization - Visit Number  0    Authorization - Number of Visits  10    PT Start Time  3646    PT Stop Time  1600    PT Time Calculation (min)  42 min    Activity Tolerance  Patient tolerated treatment well    Behavior During Therapy  [facility] for tasks assessed/performed       History reviewed. No pertinent past medical history.  Past Surgical History:  Procedure Laterality Date  . NO PAST SURGERIES      There were no vitals filed for this visit.  Subjective Assessment - 10/02/19 1521    Subjective  Patient reports she has 1/10 pain upon arrival. She states she spent about 1 hour on the tennis balls behind her head yesterday because of significant pain. She states she was able to do everything yesterday including her HEP wighout aggrevating her sympotms. She feels like she continues to have good and bad days but she is having more frequent good days. She feels satisfied she is doing something about her pain and thinks PT has been helpful. She cannot tell if dry needling helped that much this time.    Pertinent History  Overall healthy and no past surgeries.    Limitations  Lifting;House hold activities    Patient Stated Goals  Reduce headache and neck pain; to be able to have better sleep    Currently in Pain?  Yes    Pain Score  1     Pain Location  Neck    Pain Orientation  Right   right upper neck   Pain Descriptors / Indicators  --   tightness   Pain Type  Chronic pain    Pain Frequency  Constant    Aggravating Factors   worse at night, lifting up to 15 lb, pulling with R arm.    Pain Relieving Factors  pain medication; breaks    Effect of Pain on Daily Activities  Taking care of others, hot yoga practice, school, more activites.         [employer] PT Assessment - 10/02/19 0001      Assessment   Medical Diagnosis  Neck pain     Referring Provider (PT)  [name], PA-C    Onset Date/Surgical Date  --   December 2018    Hand Dominance  Right    Next MD Visit  --   in a year    Prior Therapy  None       Precautions   Precautions  None      Restrictions   Weight Bearing Restrictions  No      Home Environment   Living Environment  Private residence    Living Arrangements  Spouse/significant other    Available Help at Discharge  Family  Type of Home  Apartment      Prior Function   Level of Independence  Independent    Vocation  Student      Cognition   Overall Cognitive Status  Within Functional Limits for tasks assessed      Observation/Other Assessments   Observations  see note from 10/02/2019 for latetst objective exam    Focus on Therapeutic Outcomes (FOTO)   55      Objective:  FOTO survey: 55  Posture: able to consciously monitor her posture during standing but with minor to moderate cuing during transfers at session.   MMT: deferred to later session.    TREATMENT: Therapeutic exercise:to centralize symptoms and improve ROM, strength, muscular endurance, and activity tolerance required for successful completion of functional activities. - Objective data collected above.  - Supine cervical flexion 3x1.64mn with towel rolls under neck. Cuing to chin tuck.  - Prone plank on elbow (10s on & 10s off) 3x2 min. Patient still presents minor to moderate neck extensor muscles.  -  Extra break time requested due to muscle fatigue.  - Education provided with postural training during transfers throughout the session and cuing to relax cervical extensors.    Manual therapy: to reduce pain and tissue tension, improve range of motion, neuromodulation, in order to promote improved ability to complete functional activities. - STM at R posterior neck muscle, R occipital muscles' trigger points, manual traction for pain control.   HOME EXERCISE PROGRAM Access Code: [email or web address]        URL: [email or web address]    Date: 09/28/2019  Prepared by: SRosita Kea  Exercises Median Nerve Flossing - Tray - 3 sets - 10 reps - 1x daily - 7x weekly Standing Radial Nerve Glide - 3 sets - 10 reps - 1x daily - 7x weekly Standing Cervical Rotation AROM with Overpressure - repeated motions - 10-15 reps - 1 second hold - 10-20 every 2 hours Shoulder extension with resistance - Neutral - 3 sets - 10 reps Shoulder PNF D2 with Resistance - 3 sets - 10 reps Standing Shoulder External Rotation with Resistance - 3 sets - 10 reps Standing Bilateral Low Shoulder Row with Anchored Resistance - 3 sets - 10 reps Supine Head Nod with Deep Neck Flexor Activation - 2 sets - 10 reps - 10 seconds hold - 1x daily Prone Single Arm Shoulder Y - 2-3 sets - 10 reps Prone Shoulder Horizontal Abduction - 2-3 sets - 10 reps Prone Shoulder Extension - Single Arm - 2-3 sets - 10 reps  Clinical impression: Patient has attended 10 skilled physical therapy treatment sessions this episode of care and overall presents with fair progress towards stated goals. Subjectively, patient reports feeling increased activity tolerance, ROM, and self care pain control but she is till bothered by neck pain which impedes her yoga practice and studying. Objectively, patient demonstrates good progression with increased activity tolerance (holding dog leash with both hand) and self-care for pain control (HEP) but stated the  same for outcome measure (FOTO) and pain level (NPS). Patient continue having significant high iritability with neck pain during ADLs. Patient has been showing good pain reduction in the previous PT treatments but return to her prior level of neck pain and headache after two sessions of yoga practice. Currently patient continues to present with significant strength, ROM, pain, and activity tolerance impairments that are limiting ability to complete her usual ADLs, IADLs, community participation, manage her home, bend, lift, and ambulate community distances. Patient will  benefit from continued skilled physical therapy intervention to address current body structure impairments and activity limitations to improve function and work towards goals set in current POC in order to return to prior level of function or maximal functional improvement.        PT Education - 10/02/19 1524    Education Details  Exercise purpose/form. Self management techniques.    Person(s) Educated  Patient    Methods  Explanation;Demonstration    Comprehension  Verbalized understanding;Returned demonstration       PT Short Term Goals - 09/12/19 1920      PT SHORT TERM GOAL #1   Title  Be independent with initial home exercise program for self-management of symptoms.    Baseline  initial HEP provided at IE (08/31/2019);    Time  2    Period  Weeks    Status  Achieved    Target Date  09/14/19        PT Long Term Goals - 10/02/19 1525      PT LONG TERM GOAL #1   Title  Be independent with a long-term home exercise program for self-management of symptoms.    Baseline  initial HEP provided at IE (08/31/2019); continue updating 10/02/2019    Time  6    Period  Weeks    Status  Partially Met    Target Date  11/13/19      PT LONG TERM GOAL #2   Title  Demonstrate improved FOTO score by 10 units to demonstrate improvement in overall condition and self-reported functional ability.    Baseline  53 (08/31/2019); 55  (10/02/2019)    Time  6    Period  Weeks    Status  Partially Met    Target Date  11/13/19      PT LONG TERM GOAL #3   Title  Reduce pain with functional activities to equal or less than 1/10 to allow patient to complete usual activities including ADLs, IADLs, and social engagement with less difficulty.    Baseline  2-6/10 wose at night (08/31/2019); 5-6/10 (10/02/2019);    Time  6    Period  Weeks    Status  On-going    Target Date  11/13/19      PT LONG TERM GOAL #4   Title  Pt will increase strength of by at least 1/2 MMT grade in order to demonstrate improvement in strength and function.    Baseline  See objective notes at IE (08/31/2019); Deferred to later session due to time(10/02/2019)    Time  6    Period  Weeks    Status  Deferred    Target Date  11/13/19      PT LONG TERM GOAL #5   Title  Complete community, work and/or recreational activities (hot yoga) without limitation due to current condition.    Baseline  Unable to participate hot yoga, holding dog leash with R UE(08/31/2019); continues to avoid things that are too strenuous in yoga, uses dog leash in both hands now, continues to have difficulty with prolonged sitting and studying (10/02/2019);    Time  6    Period  Weeks    Status  On-going    Target Date  11/13/19            Plan - 10/02/19 1530    Clinical Impression Statement  Patient has attended 10 skilled physical therapy treatment sessions this episode of care and overall presents with fair progress towards stated goals.  Subjectively, patient reports feeling increased activity tolerance, ROM, and self care pain control but she is till bothered by neck pain which impedes her yoga practice and studying. Objectively, patient demonstrates good progression with increased activity tolerance (holding dog leash with both hand) and self-care for pain control (HEP) but stated the same for outcome measure (FOTO) and pain level (NPS). Patient continue having  significant high iritability with neck pain during ADLs. Patient has been showing good pain reduction in the previous PT treatments but return to her prior level of neck pain and headache after two sessions of yoga practice. Currently patient continues to present with significant strength, ROM, pain, and activity tolerance impairments that are limiting ability to complete her usual ADLs, IADLs, community participation, manage her home, bend, lift, and ambulate community distances. Patient will benefit from continued skilled physical therapy intervention to address current body structure impairments and activity limitations to improve function and work towards goals set in current POC in order to return to prior level of function or maximal functional improvement.    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    Rehab Potential  Good    PT Frequency  2x / week    PT Duration  6 weeks    PT Treatment/Interventions  ADLs/Self Care Home Management;Moist Heat;Cryotherapy;Joint Manipulations;Spinal Manipulations;Manual techniques;Therapeutic exercise;Functional mobility training;Therapeutic activities;Electrical Stimulation;Neuromuscular re-education;Patient/family education;Passive range of motion;Dry needling    PT Next Visit Plan  isometrics, manual, deep neck flexor strengthening, consider needling    PT Home Exercise Plan  Medbridge: [email or web address]    Consulted and Agree with Plan of Care  Patient       Patient will benefit from skilled therapeutic intervention in order to improve the following deficits and impairments:  Decreased activity tolerance, Decreased endurance, Decreased strength, Hypermobility, Pain, Impaired UE functional use,  Decreased range of motion, Postural dysfunction  Visit Diagnosis: Cervicalgia  Radiculopathy, cervical region  Muscle weakness (generalized)     Problem List There are no active problems to display for this patient.   Sherrilyn Rist, SPT 10/02/19, 6:53 PM  Everlean Alstrom. Graylon Good, PT, DPT 10/02/19, 6:54 PM   [facility] [address], [place], [address] Phone: [phone]   Fax:  [phone]  Name: Stephanie Russell MRN: [id number] Date of Birth: 10/11/1991

## 2019-10-05 ENCOUNTER — Encounter: Payer: Self-pay | Admitting: Physical Therapy

## 2019-10-05 ENCOUNTER — Other Ambulatory Visit: Payer: Self-pay

## 2019-10-05 ENCOUNTER — Ambulatory Visit: Payer: No Typology Code available for payment source | Admitting: Physical Therapy

## 2019-10-05 DIAGNOSIS — M542 Cervicalgia: Secondary | ICD-10-CM | POA: Diagnosis not present

## 2019-10-05 DIAGNOSIS — M5412 Radiculopathy, cervical region: Secondary | ICD-10-CM

## 2019-10-05 DIAGNOSIS — M6281 Muscle weakness (generalized): Secondary | ICD-10-CM

## 2019-10-05 NOTE — Therapy (Signed)
[facility] [address] [place], [address] Phone: [phone]   Fax:  [phone]  Physical Therapy Treatment  Patient Details  Name: Stephanie Russell MRN: [id number] Date of Birth: 1991/09/14 Referring Provider (PT): [name], [place]   Encounter Date: 10/05/2019  PT End of Session - 10/05/19 1856    Visit Number  11    Number of Visits  22    Date for PT Re-Evaluation  11/13/19    Authorization Type  [employer] focus reporting period from 10/02/2019    Authorization - Visit Number  1    Authorization - Number of Visits  10    PT Start Time  1120    PT Stop Time  1205    PT Time Calculation (min)  45 min    Activity Tolerance  Patient tolerated treatment well    Behavior During Therapy  [facility] for tasks assessed/performed       History reviewed. No pertinent past medical history.  Past Surgical History:  Procedure Laterality Date  . NO PAST SURGERIES      There were no vitals filed for this visit.  Subjective Assessment - 10/05/19 1124    Subjective  Patient reports no pain upon arrival. States she has been feeling pretty good since her last treatment session with no migraines. She did her prone Y, I, and T exercises on tuesday and her HEP is going well.    Pertinent History  Overall healthy and no past surgeries.    Limitations  Lifting;House hold activities    Patient Stated Goals  Reduce headache and neck pain; to be able to have better sleep    Currently in Pain?  No/denies          TREATMENT: Therapeutic exercise:to centralize symptoms and improve ROM, strength, muscular endurance, and activity tolerance required for successful completion of functional activities. - Supine deep cervical flexion 5 seconds x 20 reps with towel roll under neck as fulcrum Cuing to chin tuck and minimize activation of SCM.  - Prone cervical retraction 5 seconds x 20 reps. Cuing to maintain chin tuck.    -UE strengthening exercises propped on white theraball:  - Shoulder "I" extension 2x10 reps - Shoulder "Y"flexion2x10 reps - Shoulder "T" horizontal abduction2x10 reps  Cuing provided to improve forms, intensity, reps, techniques and deep neck flexor activation and cervical extensors relaxation.   - reviewed, consolidated, and updated HEP. Discussed patient's concerns and plans for return to gym exercises.    - Extra break time needed due to muscle fatigue.   Manual therapy:to reduce pain and tissue tension, improve range of motion, neuromodulation, in order to promote improved ability to complete functional activities. - STM at R posterior neck muscle, R occipital muscles' trigger points, manual traction for pain control. - Passive R cervical rotation with overpressure x 6 repetitions, one cavitation  HOME EXERCISE PROGRAM Access Code: [email or web address]  URL: [email or web address]  Date: 10/05/2019  Prepared by: Rosita Kea   Exercises  Standing Cervical Rotation AROM with Overpressure - repeated motions - 10-15 reps - 1 second hold - 10-20 every 2 hours  Supine Deep Neck Flexor Training - 1 sets - 20 reps - 5 second hold - 1x daily - 7x weekly  Prone Cervical Retraction - 1 sets - 20 reps - 5 seconds hold - 1x daily - 7x weekly  Prone Scapular Slide with Shoulder Extension - 3 sets - 10 reps  Prone Shoulder Horizontal  Abduction with Thumbs Up - 3 sets - 10 reps  Prone Single Arm Shoulder Y - 2-3 sets - 10 reps   PT Education - 10/05/19 1856    Education Details  Exercise purpose/form. Self management techniques.    Person(s) Educated  Patient    Methods  Explanation;Demonstration;Tactile cues;Verbal cues;Handout    Comprehension  Verbalized understanding;Returned demonstration;Verbal cues required;Tactile cues required;Need further instruction       PT Short Term Goals - 09/12/19 1920      PT SHORT TERM GOAL #1   Title  Be  independent with initial home exercise program for self-management of symptoms.    Baseline  initial HEP provided at IE (08/31/2019);    Time  2    Period  Weeks    Status  Achieved    Target Date  09/14/19        PT Long Term Goals - 10/02/19 1525      PT LONG TERM GOAL #1   Title  Be independent with a long-term home exercise program for self-management of symptoms.    Baseline  initial HEP provided at IE (08/31/2019); continue updating 10/02/2019    Time  6    Period  Weeks    Status  Partially Met    Target Date  11/13/19      PT LONG TERM GOAL #2   Title  Demonstrate improved FOTO score by 10 units to demonstrate improvement in overall condition and self-reported functional ability.    Baseline  53 (08/31/2019); 55 (10/02/2019)    Time  6    Period  Weeks    Status  Partially Met    Target Date  11/13/19      PT LONG TERM GOAL #3   Title  Reduce pain with functional activities to equal or less than 1/10 to allow patient to complete usual activities including ADLs, IADLs, and social engagement with less difficulty.    Baseline  2-6/10 wose at night (08/31/2019); 5-6/10 (10/02/2019);    Time  6    Period  Weeks    Status  On-going    Target Date  11/13/19      PT LONG TERM GOAL #4   Title  Pt will increase strength of by at least 1/2 MMT grade in order to demonstrate improvement in strength and function.    Baseline  See objective notes at IE (08/31/2019); Deferred to later session due to time(10/02/2019)    Time  6    Period  Weeks    Status  Deferred    Target Date  11/13/19      PT LONG TERM GOAL #5   Title  Complete community, work and/or recreational activities (hot yoga) without limitation due to current condition.    Baseline  Unable to participate hot yoga, holding dog leash with R UE(08/31/2019); continues to avoid things that are too strenuous in yoga, uses dog leash in both hands now, continues to have difficulty with prolonged sitting and studying  (10/02/2019);    Time  6    Period  Weeks    Status  On-going    Target Date  11/13/19            Plan - 10/05/19 1902    Clinical Impression Statement  Pt tolerated treatment well and is making progress towards her goals at this point. Focused on improved cervical spine postural, flexion and extension endurance and motor control as well as postural strengthening and isolating B UEs  from neck motions. Manual applied to promote relaxation of trigger points and for pain control. HEP updated for improved self-management. Started planning return to gym exercises. Pt was able to complete all exercises with minimal to no lasting increase in pain or discomfort. Pt required multimodal cuing for proper technique and to facilitate improved neuromuscular control, strength, range of motion, and functional ability resulting in improved performance and form. Able to provoke familiar pain during manual therapy but improved following. Patient would benefit from continued physical therapy to address remaining impairments and functional limitations to work towards stated goals and return to PLOF or maximal functional independence.    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    Rehab Potential  Good    PT Frequency  2x / week    PT Duration  6 weeks    PT Treatment/Interventions  ADLs/Self Care Home Management;Moist Heat;Cryotherapy;Joint Manipulations;Spinal Manipulations;Manual techniques;Therapeutic exercise;Functional mobility training;Therapeutic activities;Electrical Stimulation;Neuromuscular re-education;Patient/family education;Passive range of motion;Dry needling    PT Next Visit Plan  isometrics, manual, deep neck flexor strengthening,  consider needling    PT Home Exercise Plan  Medbridge: [email or web address]    Consulted and Agree with Plan of Care  Patient       Patient will benefit from skilled therapeutic intervention in order to improve the following deficits and impairments:  Decreased activity tolerance, Decreased endurance, Decreased strength, Hypermobility, Pain, Impaired UE functional use, Decreased range of motion, Postural dysfunction  Visit Diagnosis: Cervicalgia  Radiculopathy, cervical region  Muscle weakness (generalized)     Problem List There are no active problems to display for this patient.  Everlean Alstrom. Graylon Good, PT, DPT 10/05/19, 7:03 PM  [facility] [address], [place], [address] Phone: [phone]   Fax:  [phone]  Name: Stephanie Russell MRN: [id number] Date of Birth: 31-Aug-1991

## 2019-10-09 ENCOUNTER — Ambulatory Visit: Payer: No Typology Code available for payment source | Admitting: Physical Therapy

## 2019-10-09 ENCOUNTER — Encounter: Payer: Self-pay | Admitting: Physical Therapy

## 2019-10-09 ENCOUNTER — Other Ambulatory Visit: Payer: Self-pay

## 2019-10-09 DIAGNOSIS — M6281 Muscle weakness (generalized): Secondary | ICD-10-CM

## 2019-10-09 DIAGNOSIS — M542 Cervicalgia: Secondary | ICD-10-CM | POA: Diagnosis not present

## 2019-10-09 DIAGNOSIS — M5412 Radiculopathy, cervical region: Secondary | ICD-10-CM

## 2019-10-09 NOTE — Therapy (Signed)
[facility] [address] [place], [address] Phone: [phone]   Fax:  [phone]  Physical Therapy Treatment  Patient Details  Name: Stephanie Russell MRN: [id number] Date of Birth: [birth year] Referring Provider (PT): [name], [place]   Encounter Date: 10/09/2019  PT End of Session - 10/09/19 1621    Visit Number  12    Number of Visits  22    Date for PT Re-Evaluation  11/13/19    Authorization Type  [employer] focus reporting period from 10/02/2019    Authorization - Visit Number  2    Authorization - Number of Visits  10    PT Start Time  1525    PT Stop Time  1605    PT Time Calculation (min)  40 min    Activity Tolerance  Patient tolerated treatment well    Behavior During Therapy  [facility] for tasks assessed/performed       History reviewed. No pertinent past medical history.  Past Surgical History:  Procedure Laterality Date  . NO PAST SURGERIES      There were no vitals filed for this visit.  Subjective Assessment - 10/09/19 1523    Subjective  Patient reports she had been doing well since her last treatment session and doing her HEP well unitl yesterday she felt like she bruised her L rib when laying on her stomach doing her posture exercises. Today she had return of behind the eye pain and buring down the R scapula 3/10 while doing nothing today. She has studied this morning and some stretching this morning. Rib pain is slightly better today and hurts when she laughs and it is a shooting pain.    Pertinent History  Overall healthy and no past surgeries.    Limitations  Lifting;House hold activities    Patient Stated Goals  Reduce headache and neck pain; to be able to have better sleep    Currently in Pain?  Yes    Pain Score  3        TREATMENT: Therapeutic exercise:to centralize symptoms and improve ROM, strength, muscular endurance, and activity tolerance required for successful  completion of functional activities. - Supine deep cervical flexion5 seconds x 30 repswith towel roll under neck as fulcrum Cuing to chin tuck and minimize activation of SCM.    -UE/postural strengthening exercisesin quadruped:  - Shoulder"I" extension x10 reps each side - Shoulder"Y"flexionx10 reps each side - Shoulder"T" horizontal abductionx10 reps each side Cuing provided to improve forms,intensity, reps,techniquesand deep neck flexor activation and cervical extensors coordination - discussed update in HEP to include exercises in quadruped or band exercises.  - break time between sets needed due to muscle fatigue.  Manual therapy:to reduce pain and tissue tension, improve range of motion, neuromodulation, in order to promote improved ability to complete functional activities. - STM at R posterior neck muscle, R occipital muscles' trigger points, manual traction for pain control. - Passive R cervical rotation with overpressure 2x 6 repetitions, one cavitation - Passive L cervical rotatoin with overpressure x2 repetition, one cavitation - Passive cervical spine flexion with clinician overpressure x 10 global flexion, x 10 focusing on upper cervical spine, x10 upper cervical spine with slight R rotation and x 10 with slight L rotation.  - R upper cervical spine rotational gapping mobilization grade II-IV, one small caviation.   HOME EXERCISE PROGRAM Access Code: [email or web address]  URL: [email or web address]  Date: 10/05/2019  Prepared by: Rosita Kea  Exercises  Standing Cervical Rotation AROM with Overpressure - repeated motions - 10-15 reps - 1 second hold - 10-20 every 2 hours  Supine Deep Neck Flexor Training - 1 sets - 20 reps - 5 second hold - 1x daily - 7x weekly  Prone Cervical Retraction - 1 sets - 20 reps - 5 seconds hold - 1x daily - 7x weekly  Prone Scapular Slide with Shoulder Extension - 3 sets - 10 reps  Prone  Shoulder Horizontal Abduction with Thumbs Up - 3 sets - 10 reps  Prone Single Arm Shoulder Y - 2-3 sets - 10 reps     PT Education - 10/09/19 1621    Education Details  Exercise purpose/form. Self management techniques. HEP modifications    Person(s) Educated  Patient    Methods  Explanation;Demonstration;Tactile cues;Verbal cues    Comprehension  Verbalized understanding;Returned demonstration;Verbal cues required;Tactile cues required;Need further instruction       PT Short Term Goals - 09/12/19 1920      PT SHORT TERM GOAL #1   Title  Be independent with initial home exercise program for self-management of symptoms.    Baseline  initial HEP provided at IE (08/31/2019);    Time  2    Period  Weeks    Status  Achieved    Target Date  09/14/19        PT Long Term Goals - 10/02/19 1525      PT LONG TERM GOAL #1   Title  Be independent with a long-term home exercise program for self-management of symptoms.    Baseline  initial HEP provided at IE (08/31/2019); continue updating 10/02/2019    Time  6    Period  Weeks    Status  Partially Met    Target Date  11/13/19      PT LONG TERM GOAL #2   Title  Demonstrate improved FOTO score by 10 units to demonstrate improvement in overall condition and self-reported functional ability.    Baseline  53 (08/31/2019); 55 (10/02/2019)    Time  6    Period  Weeks    Status  Partially Met    Target Date  11/13/19      PT LONG TERM GOAL #3   Title  Reduce pain with functional activities to equal or less than 1/10 to allow patient to complete usual activities including ADLs, IADLs, and social engagement with less difficulty.    Baseline  2-6/10 wose at night (08/31/2019); 5-6/10 (10/02/2019);    Time  6    Period  Weeks    Status  On-going    Target Date  11/13/19      PT LONG TERM GOAL #4   Title  Pt will increase strength of by at least 1/2 MMT grade in order to demonstrate improvement in strength and function.    Baseline  See  objective notes at IE (08/31/2019); Deferred to later session due to time(10/02/2019)    Time  6    Period  Weeks    Status  Deferred    Target Date  11/13/19      PT LONG TERM GOAL #5   Title  Complete community, work and/or recreational activities (hot yoga) without limitation due to current condition.    Baseline  Unable to participate hot yoga, holding dog leash with R UE(08/31/2019); continues to avoid things that are too strenuous in yoga, uses dog leash in both hands now, continues to have difficulty with prolonged sitting  and studying (10/02/2019);    Time  6    Period  Weeks    Status  On-going    Target Date  11/13/19            Plan - 10/09/19 1620    Clinical Impression Statement  Patient tolerated treatment well and her scapular pain completely resolved and she no longer felt any pain behind her eye except with prolonged active cervical flexion following manual techniques. She was able to complete exercises without increased pain and modifications were made due to rib pain. Ribs were not formally assessed to prevent aggravating pain there that patient reports is improving gradually since it started. Reviewed modification for HEP. Patient continues to demo very poor postural and shoulder girdle endurance but is improving with coordination and understanding of techniques. 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    Rehab Potential  Good    PT Frequency  2x / week    PT Duration  6 weeks    PT  Treatment/Interventions  ADLs/Self Care Home Management;Moist Heat;Cryotherapy;Joint Manipulations;Spinal Manipulations;Manual techniques;Therapeutic exercise;Functional mobility training;Therapeutic activities;Electrical Stimulation;Neuromuscular re-education;Patient/family education;Passive range of motion;Dry needling    PT Next Visit Plan  isometrics, manual, deep neck flexor strengthening, consider needling    PT Home Exercise Plan  Medbridge: [email or web address]    Consulted and Agree with Plan of Care  Patient       Patient will benefit from skilled therapeutic intervention in order to improve the following deficits and impairments:  Decreased activity tolerance, Decreased endurance, Decreased strength, Hypermobility, Pain, Impaired UE functional use, Decreased range of motion, Postural dysfunction  Visit Diagnosis: Cervicalgia  Radiculopathy, cervical region  Muscle weakness (generalized)     Problem List There are no active problems to display for this patient.  Everlean Alstrom. Graylon Good, PT, DPT 10/09/19, 4:22 PM  [facility] [address] [place], [address] Phone: [phone]   Fax:  [phone]  Name: Stephanie Russell MRN: [id number] Date of Birth: 1991-07-16

## 2019-10-10 ENCOUNTER — Encounter: Payer: No Typology Code available for payment source | Admitting: Physical Therapy

## 2019-10-11 ENCOUNTER — Encounter: Payer: No Typology Code available for payment source | Admitting: Physical Therapy

## 2019-10-16 ENCOUNTER — Ambulatory Visit: Payer: No Typology Code available for payment source | Admitting: Physical Therapy

## 2019-10-16 ENCOUNTER — Other Ambulatory Visit: Payer: Self-pay

## 2019-10-16 DIAGNOSIS — M542 Cervicalgia: Secondary | ICD-10-CM | POA: Diagnosis not present

## 2019-10-16 DIAGNOSIS — M6281 Muscle weakness (generalized): Secondary | ICD-10-CM

## 2019-10-16 DIAGNOSIS — M5412 Radiculopathy, cervical region: Secondary | ICD-10-CM

## 2019-10-16 NOTE — Therapy (Signed)
[facility] [address], [place], [address] Phone: [phone]   Fax:  [phone]  Physical Therapy Treatment  Patient Details  Name: Stephanie Russell MRN: [id number] Date of Birth: 1991/10/08 Referring Provider (PT): [name], [place]   Encounter Date: 10/16/2019  PT End of Session - 10/16/19 1756    Visit Number  13    Number of Visits  22    Date for PT Re-Evaluation  11/13/19    Authorization Type  [employer] focus reporting period from 10/02/2019    Authorization - Visit Number  3    Authorization - Number of Visits  10    PT Start Time  4784    PT Stop Time  1745    PT Time Calculation (min)  35 min    Activity Tolerance  Patient tolerated treatment well;Patient limited by pain    Behavior During Therapy  [facility] for tasks assessed/performed       No past medical history on file.  Past Surgical History:  Procedure Laterality Date  . NO PAST SURGERIES      There were no vitals filed for this visit.  Subjective Assessment - 10/16/19 1711    Subjective  Patient reports she has 5/10 migraine behind eyes and the front of her head. R side is worse. States she felt good for a few days folloiwng last treatment session but the last two days have been band. Rib is back to normal and she has returned to prone exercise.    Pertinent History  Overall healthy and no past surgeries.    Limitations  Lifting;House hold activities    Patient Stated Goals  Reduce headache and neck pain; to be able to have better sleep    Currently in Pain?  Yes    Pain Score  5     Pain Location  Head       TREATMENT:  Manual therapy:to reduce pain and tissue tension, improve range of motion, neuromodulation, in order to promote improved ability to complete functional activities. - STM at B posterior neck musculature including B upper traps, paraspinals, trigger point release at bilateral suboccipitals. , manual traction  for pain control. - L sidebend, R Rotation mobilization at upper cervical spine grade II-III  - CPA grade II-III along cervical spine.  - prone CPA grade III-IV along thoracic spine.   Modalities: (Unbilled) Dry needling performed to R suboccipital musculature to decrease pain and spasms along patient's upper neck/base of skull with patient in prone utilizing (1) dry needle(s) .25 mm x 12m. Patient educated about the risks and benefits from therapy and verbally consents to treatment. Dry needling performed by WBlythe Stanford PT, DPT who is certified in the technique.   HOME EXERCISE PROGRAM Access Code: [email or web address] URL: [email or web address]  Date: 10/05/2019  Prepared by: SRosita Kea  Exercises  Standing Cervical Rotation AROM with Overpressure - repeated motions - 10-15 reps - 1 second hold - 10-20 every 2 hours  Supine Deep Neck Flexor Training - 1 sets - 20 reps - 5 second hold - 1x daily - 7x weekly  Prone Cervical Retraction - 1 sets - 20 reps - 5 seconds hold - 1x daily - 7x weekly  Prone Scapular Slide with Shoulder Extension - 3 sets - 10 reps  Prone Shoulder Horizontal Abduction with Thumbs Up - 3 sets - 10 reps  Prone Single Arm Shoulder Y - 2-3 sets - 10 reps  PT Education - 10/16/19 1756    Education Details  Exercise purpose/form. Self management techniques    Person(s) Educated  Patient    Methods  Explanation    Comprehension  Verbalized understanding;Need further instruction       PT Short Term Goals - 09/12/19 1920      PT SHORT TERM GOAL #1   Title  Be independent with initial home exercise program for self-management of symptoms.    Baseline  initial HEP provided at IE (08/31/2019);    Time  2    Period  Weeks    Status  Achieved    Target Date  09/14/19        PT Long Term Goals - 10/02/19 1525      PT LONG TERM GOAL #1   Title  Be independent with a long-term home exercise program for self-management of symptoms.    Baseline   initial HEP provided at IE (08/31/2019); continue updating 10/02/2019    Time  6    Period  Weeks    Status  Partially Met    Target Date  11/13/19      PT LONG TERM GOAL #2   Title  Demonstrate improved FOTO score by 10 units to demonstrate improvement in overall condition and self-reported functional ability.    Baseline  53 (08/31/2019); 55 (10/02/2019)    Time  6    Period  Weeks    Status  Partially Met    Target Date  11/13/19      PT LONG TERM GOAL #3   Title  Reduce pain with functional activities to equal or less than 1/10 to allow patient to complete usual activities including ADLs, IADLs, and social engagement with less difficulty.    Baseline  2-6/10 wose at night (08/31/2019); 5-6/10 (10/02/2019);    Time  6    Period  Weeks    Status  On-going    Target Date  11/13/19      PT LONG TERM GOAL #4   Title  Pt will increase strength of by at least 1/2 MMT grade in order to demonstrate improvement in strength and function.    Baseline  See objective notes at IE (08/31/2019); Deferred to later session due to time(10/02/2019)    Time  6    Period  Weeks    Status  Deferred    Target Date  11/13/19      PT LONG TERM GOAL #5   Title  Complete community, work and/or recreational activities (hot yoga) without limitation due to current condition.    Baseline  Unable to participate hot yoga, holding dog leash with R UE(08/31/2019); continues to avoid things that are too strenuous in yoga, uses dog leash in both hands now, continues to have difficulty with prolonged sitting and studying (10/02/2019);    Time  6    Period  Weeks    Status  On-going    Target Date  11/13/19            Plan - 10/16/19 1910    Clinical Impression Statement  Patient tolerated treatment well and reported decrease in neck pain and tightness by end of session but continued to have headache over the forehead. Did not complete exercises today due to presence of migraine headache. Focus of session  on pain control. Good response to dry needling with report of decreased tension. Patient appears to respond well to manual techniques but improvement does not carry over for a  full week at this point.  Patient would benefit from continued physical therapy to address remaining impairments and functional limitations to work towards stated goals and return to PLOF or maximal functional independence.    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    Rehab Potential  Good    PT Frequency  2x / week    PT Duration  6 weeks    PT Treatment/Interventions  ADLs/Self Care Home Management;Moist Heat;Cryotherapy;Joint Manipulations;Spinal Manipulations;Manual techniques;Therapeutic exercise;Functional mobility training;Therapeutic activities;Electrical Stimulation;Neuromuscular re-education;Patient/family education;Passive range of motion;Dry needling    PT Next Visit Plan  isometrics, manual, deep neck flexor strengthening, consider needling    PT Home Exercise Plan  Medbridge: [email or web address]    Consulted and Agree with Plan of Care  Patient       Patient will benefit from skilled therapeutic intervention in order to improve the following deficits and impairments:  Decreased activity tolerance, Decreased endurance, Decreased strength, Hypermobility, Pain, Impaired UE functional use, Decreased range of motion, Postural dysfunction  Visit Diagnosis: Cervicalgia  Radiculopathy, cervical region  Muscle weakness (generalized)     Problem List There are no active problems to display for this patient.   Everlean Alstrom. Graylon Good, PT, DPT 10/16/19, 7:11 PM  [facility] [address] [place], [address] Phone: [phone]   Fax:  [phone]  Name: Stephanie Russell MRN: [id number] Date of Birth: 08/19/1991

## 2019-10-18 ENCOUNTER — Encounter: Payer: Self-pay | Admitting: Physical Therapy

## 2019-10-18 ENCOUNTER — Ambulatory Visit: Payer: No Typology Code available for payment source | Attending: Physician Assistant | Admitting: Physical Therapy

## 2019-10-18 ENCOUNTER — Other Ambulatory Visit: Payer: Self-pay

## 2019-10-18 DIAGNOSIS — M542 Cervicalgia: Secondary | ICD-10-CM | POA: Insufficient documentation

## 2019-10-18 DIAGNOSIS — M5412 Radiculopathy, cervical region: Secondary | ICD-10-CM | POA: Diagnosis present

## 2019-10-18 DIAGNOSIS — M6281 Muscle weakness (generalized): Secondary | ICD-10-CM | POA: Diagnosis present

## 2019-10-18 NOTE — Therapy (Signed)
[facility] [address], [place], [address] Phone: [phone]   Fax:  [phone]  Physical Therapy Treatment  Patient Details  Name: Meily Glowacki MRN: [id number] Date of Birth: August 28, 1991 Referring Provider (PT): [name], [place]   Encounter Date: 10/18/2019  PT End of Session - 10/18/19 1855    Visit Number  14    Number of Visits  22    Date for PT Re-Evaluation  11/13/19    Authorization Type  [employer] focus reporting period from 10/02/2019    Authorization - Visit Number  4    Authorization - Number of Visits  10    PT Start Time  1350    PT Stop Time  1430    PT Time Calculation (min)  40 min    Activity Tolerance  Patient tolerated treatment well    Behavior During Therapy  [facility] for tasks assessed/performed       History reviewed. No pertinent past medical history.  Past Surgical History:  Procedure Laterality Date  . NO PAST SURGERIES      There were no vitals filed for this visit.  Subjective Assessment - 10/18/19 1354    Subjective  Patient reports she is feeling good today and had no migraine yesterday or today. She woke up yesterday morning without a migraine. She was able to do her HEP yesterday with mild discomfort near her R eye and in her scapular region that went away when she was done with the exercises. Had her last final exam for the semester today and just took a 3 hour nap prior to her PT session.    Pertinent History  Overall healthy and no past surgeries.    Limitations  Lifting;House hold activities    Patient Stated Goals  Reduce headache and neck pain; to be able to have better sleep    Currently in Pain?  No/denies        TREATMENT: Therapeutic exercise:to centralize symptoms and improve ROM, strength, muscular endurance, and activity tolerance required for successful completion of functional activities.  - Upper body ergometer with no added resistance  encourage joint nutrition, warm tissue, induce analgesic effect of aerobic exercise, improve muscular strength and endurance,  and prepare for remainder of session. x5 minutes.  (manual therapy - see below)   - seated lat pull down facing bar with isolated scapular elevation/depression to improve postural control and strength. X10, x20.   Completed as circuit:  - bent over rows with 5# dumbbells, 3x10. From 18.5 inch chair placed in front of knees to help with bent over form.  - modified push up with plus on treadmill bars, 3x10.   Cuing provided to improve form,intensity, reps,techniquesand deep neck flexor activation and cervical extensors coordination during exercises.   Manual therapy:to reduce pain and tissue tension, improve range of motion, neuromodulation, in order to promote improved ability to complete functional activities. - STM at R posterior neck muscle, R occipital muscle trigger points, manual traction for pain control.   HOME EXERCISE PROGRAM Access Code: [email or web address]  URL: [email or web address]  Date: 10/05/2019  Prepared by: Rosita Kea   Exercises  Standing Cervical Rotation AROM with Overpressure - repeated motions - 10-15 reps - 1 second hold - 10-20 every 2 hours  Supine Deep Neck Flexor Training - 1 sets - 20 reps - 5 second hold - 1x daily - 7x weekly  Prone Cervical Retraction - 1 sets - 20 reps -  5 seconds hold - 1x daily - 7x weekly  Prone Scapular Slide with Shoulder Extension - 3 sets - 10 reps  Prone Shoulder Horizontal Abduction with Thumbs Up - 3 sets - 10 reps  Prone Single Arm Shoulder Y - 2-3 sets - 10 reps   PT Education - 10/18/19 1855    Education Details  Exercise purpose/form. Self management techniques    Person(s) Educated  Patient    Methods  Explanation;Demonstration;Tactile cues;Verbal cues    Comprehension  Verbalized understanding;Returned demonstration;Verbal cues required;Tactile cues required;Need further  instruction       PT Short Term Goals - 09/12/19 1920      PT SHORT TERM GOAL #1   Title  Be independent with initial home exercise program for self-management of symptoms.    Baseline  initial HEP provided at IE (08/31/2019);    Time  2    Period  Weeks    Status  Achieved    Target Date  09/14/19        PT Long Term Goals - 10/02/19 1525      PT LONG TERM GOAL #1   Title  Be independent with a long-term home exercise program for self-management of symptoms.    Baseline  initial HEP provided at IE (08/31/2019); continue updating 10/02/2019    Time  6    Period  Weeks    Status  Partially Met    Target Date  11/13/19      PT LONG TERM GOAL #2   Title  Demonstrate improved FOTO score by 10 units to demonstrate improvement in overall condition and self-reported functional ability.    Baseline  53 (08/31/2019); 55 (10/02/2019)    Time  6    Period  Weeks    Status  Partially Met    Target Date  11/13/19      PT LONG TERM GOAL #3   Title  Reduce pain with functional activities to equal or less than 1/10 to allow patient to complete usual activities including ADLs, IADLs, and social engagement with less difficulty.    Baseline  2-6/10 wose at night (08/31/2019); 5-6/10 (10/02/2019);    Time  6    Period  Weeks    Status  On-going    Target Date  11/13/19      PT LONG TERM GOAL #4   Title  Pt will increase strength of by at least 1/2 MMT grade in order to demonstrate improvement in strength and function.    Baseline  See objective notes at IE (08/31/2019); Deferred to later session due to time(10/02/2019)    Time  6    Period  Weeks    Status  Deferred    Target Date  11/13/19      PT LONG TERM GOAL #5   Title  Complete community, work and/or recreational activities (hot yoga) without limitation due to current condition.    Baseline  Unable to participate hot yoga, holding dog leash with R UE(08/31/2019); continues to avoid things that are too strenuous in yoga, uses  dog leash in both hands now, continues to have difficulty with prolonged sitting and studying (10/02/2019);    Time  6    Period  Weeks    Status  On-going    Target Date  11/13/19            Plan - 10/18/19 1906    Clinical Impression Statement  Patient tolerated treatment well and was able to complete progressed  functional exercises with acceptable form and no increased pain. Continued with manual therapy due to good response. Continues to demonstrate good within-visit improvements. Continues to demonstrate poor muscular strength and endurance for scapular, postural, and neck during functional activity and limited activity tolerance that is decreasing her quality of life and interfering with her functional activities. Patient would benefit from continued physical therapy to address remaining impairments and functional limitations to work towards stated goals and return to PLOF or maximal functional independence.    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    Rehab Potential  Good    PT Frequency  2x / week    PT Duration  6 weeks    PT Treatment/Interventions  ADLs/Self Care Home Management;Moist Heat;Cryotherapy;Joint Manipulations;Spinal Manipulations;Manual techniques;Therapeutic exercise;Functional mobility training;Therapeutic activities;Electrical Stimulation;Neuromuscular re-education;Patient/family education;Passive range of motion;Dry needling    PT Next Visit Plan  isometrics, manual, deep neck flexor strengthening, consider needling    PT Home Exercise Plan  Medbridge: [email or web address]    Consulted and Agree with Plan of Care  Patient       Patient will benefit from skilled therapeutic intervention in order to  improve the following deficits and impairments:  Decreased activity tolerance, Decreased endurance, Decreased strength, Hypermobility, Pain, Impaired UE functional use, Decreased range of motion, Postural dysfunction  Visit Diagnosis: Cervicalgia  Radiculopathy, cervical region  Muscle weakness (generalized)     Problem List There are no active problems to display for this patient.  Everlean Alstrom. Graylon Good, PT, DPT 10/18/19, 7:07 PM  [facility] [address] [place], [address] Phone: [phone]   Fax:  [phone]  Name: Medha Pippen MRN: [id number] Date of Birth: [birth year]

## 2019-10-23 ENCOUNTER — Encounter: Payer: Self-pay | Admitting: Physical Therapy

## 2019-10-23 ENCOUNTER — Other Ambulatory Visit: Payer: Self-pay

## 2019-10-23 ENCOUNTER — Ambulatory Visit: Payer: No Typology Code available for payment source | Admitting: Physical Therapy

## 2019-10-23 DIAGNOSIS — M542 Cervicalgia: Secondary | ICD-10-CM | POA: Diagnosis not present

## 2019-10-23 DIAGNOSIS — M5412 Radiculopathy, cervical region: Secondary | ICD-10-CM

## 2019-10-23 DIAGNOSIS — M6281 Muscle weakness (generalized): Secondary | ICD-10-CM

## 2019-10-23 NOTE — Therapy (Signed)
[facility] [address], [place], [address] Phone: [phone]   Fax:  [phone]  Physical Therapy Treatment  Patient Details  Name: Stephanie Russell MRN: [id number] Date of Birth: 1991/08/11 Referring Provider (PT): [name], [place]   Encounter Date: 10/23/2019  PT End of Session - 10/23/19 1847    Visit Number  15    Number of Visits  22    Date for PT Re-Evaluation  11/13/19    Authorization Type  [employer] focus reporting period from 10/02/2019    Authorization - Visit Number  5    Authorization - Number of Visits  10    PT Start Time  1840    PT Stop Time  1920    PT Time Calculation (min)  40 min    Activity Tolerance  Patient tolerated treatment well    Behavior During Therapy  [facility] for tasks assessed/performed       History reviewed. No pertinent past medical history.  Past Surgical History:  Procedure Laterality Date  . NO PAST SURGERIES      There were no vitals filed for this visit.  Subjective Assessment - 10/23/19 1844    Subjective  Patient reports she is feeling pretty good today. Denies any of her usual pain but feels like she slept wrong and "tweaked" the left side of her neck when she woke up this morning. Otherwise she has felt okay and she has been doing her HEP and it has been going well. It's possible they are getting a bit easier. No attempts to go to the gym.    Pertinent History  Overall healthy and no past surgeries.    Limitations  Lifting;House hold activities    Patient Stated Goals  Reduce headache and neck pain; to be able to have better sleep    Currently in Pain?  Yes    Pain Score  1    only in certain positions   Pain Orientation  Left        TREATMENT: Therapeutic exercise:to centralize symptoms and improve ROM, strength, muscular endurance, and activity tolerance required for successful completion of functional activities.  - Upper body  ergometerwith no added resistanceencourage joint nutrition, warm tissue, induce analgesic effect of aerobic exercise, improve muscular strength and endurance, and prepare for remainder of session. x5 minutes.  (manual therapy - see below)   - prone scapular retraction  and postural strengthening: "I" (shoulder extension) "T" (horizontal abduction), and "Y" (scaption), x10 with 1# weight at I and T, removed for Y. Then x10 on blue face cradle. with 2# weight I, 1# weight at T, and no weight for Y. Then on corner of plinth x10 each with 2# weight I, 2# weight x5/no weight x5 Ts, no weight Ys. Multimodal cuing for improved posture.   - prone Clifford CARs x 10, for dynamic strengthening of the upper thoracic region.    Cuing provided to improve form,intensity, reps,techniquesand deep neck flexor activation and cervical extensorscoordination during exercises.   Manual therapy:to reduce pain and tissue tension, improve range of motion, neuromodulation, in order to promote improved ability to complete functional activities. - STM B posterior neck muscle, R occipital muscle trigger points, manual traction for pain control.   HOME EXERCISE PROGRAM Access Code: [email or web address]  URL: [email or web address]  Date: 10/05/2019  Prepared by: Rosita Kea   Exercises  Standing Cervical Rotation AROM with Overpressure - repeated motions - 10-15 reps - 1 second  hold - 10-20 every 2 hours  Supine Deep Neck Flexor Training - 1 sets - 20 reps - 5 second hold - 1x daily - 7x weekly  Prone Cervical Retraction - 1 sets - 20 reps - 5 seconds hold - 1x daily - 7x weekly  Prone Scapular Slide with Shoulder Extension - 3 sets - 10 reps  Prone Shoulder Horizontal Abduction with Thumbs Up - 3 sets - 10 reps  Prone Single Arm Shoulder Y - 2-3 sets - 10 reps    PT Education - 10/23/19 1935    Education Details  Exercise purpose/form. Self management techniques    Person(s) Educated  Patient     Methods  Explanation;Demonstration;Tactile cues;Verbal cues    Comprehension  Verbalized understanding;Returned demonstration;Verbal cues required;Tactile cues required;Need further instruction       PT Short Term Goals - 09/12/19 1920      PT SHORT TERM GOAL #1   Title  Be independent with initial home exercise program for self-management of symptoms.    Baseline  initial HEP provided at IE (08/31/2019);    Time  2    Period  Weeks    Status  Achieved    Target Date  09/14/19        PT Long Term Goals - 10/02/19 1525      PT LONG TERM GOAL #1   Title  Be independent with a long-term home exercise program for self-management of symptoms.    Baseline  initial HEP provided at IE (08/31/2019); continue updating 10/02/2019    Time  6    Period  Weeks    Status  Partially Met    Target Date  11/13/19      PT LONG TERM GOAL #2   Title  Demonstrate improved FOTO score by 10 units to demonstrate improvement in overall condition and self-reported functional ability.    Baseline  53 (08/31/2019); 55 (10/02/2019)    Time  6    Period  Weeks    Status  Partially Met    Target Date  11/13/19      PT LONG TERM GOAL #3   Title  Reduce pain with functional activities to equal or less than 1/10 to allow patient to complete usual activities including ADLs, IADLs, and social engagement with less difficulty.    Baseline  2-6/10 wose at night (08/31/2019); 5-6/10 (10/02/2019);    Time  6    Period  Weeks    Status  On-going    Target Date  11/13/19      PT LONG TERM GOAL #4   Title  Pt will increase strength of by at least 1/2 MMT grade in order to demonstrate improvement in strength and function.    Baseline  See objective notes at IE (08/31/2019); Deferred to later session due to time(10/02/2019)    Time  6    Period  Weeks    Status  Deferred    Target Date  11/13/19      PT LONG TERM GOAL #5   Title  Complete community, work and/or recreational activities (hot yoga) without  limitation due to current condition.    Baseline  Unable to participate hot yoga, holding dog leash with R UE(08/31/2019); continues to avoid things that are too strenuous in yoga, uses dog leash in both hands now, continues to have difficulty with prolonged sitting and studying (10/02/2019);    Time  6    Period  Weeks    Status  On-going    Target Date  11/13/19            Plan - 10/23/19 1934    Clinical Impression Statement  Patient tolerated treatment well and is continuing to make progress towards goals. Demonstrated improved carryover from past treatment sessions. Also has reduced stress and improved sleep since last session due to break in academic schedule. Patient able to progress to heavier weights today for prone postural exercises but appears to struggle with end range and may benefit from more full range strengthening as well as return to push up, bent over row, and lat pull with conscious focus on cervical spine mechanics. Patient would benefit from continued physical therapy to address remaining impairments and functional limitations to work towards stated goals and return to PLOF or maximal functional independence.    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    Rehab Potential  Good    PT Frequency  2x / week    PT Duration  6 weeks    PT Treatment/Interventions  ADLs/Self Care Home Management;Moist Heat;Cryotherapy;Joint Manipulations;Spinal Manipulations;Manual techniques;Therapeutic exercise;Functional mobility training;Therapeutic activities;Electrical Stimulation;Neuromuscular re-education;Patient/family education;Passive range of motion;Dry needling    PT Next Visit Plan  isometrics, manual, deep neck  flexor strengthening, consider needling    PT Home Exercise Plan  Medbridge: [email or web address]    Consulted and Agree with Plan of Care  Patient       Patient will benefit from skilled therapeutic intervention in order to improve the following deficits and impairments:  Decreased activity tolerance, Decreased endurance, Decreased strength, Hypermobility, Pain, Impaired UE functional use, Decreased range of motion, Postural dysfunction  Visit Diagnosis: Cervicalgia  Radiculopathy, cervical region  Muscle weakness (generalized)     Problem List There are no active problems to display for this patient.   Everlean Alstrom. Graylon Good, PT, DPT 10/23/19, 7:35 PM  [facility] [address], [place], [address] Phone: [phone]   Fax:  [phone]  Name: Stephanie Russell MRN: [id number] Date of Birth: [birth year]

## 2019-10-26 ENCOUNTER — Ambulatory Visit: Payer: No Typology Code available for payment source | Admitting: Physical Therapy

## 2019-10-26 ENCOUNTER — Encounter: Payer: Self-pay | Admitting: Physical Therapy

## 2019-10-26 ENCOUNTER — Other Ambulatory Visit: Payer: Self-pay

## 2019-10-26 DIAGNOSIS — M6281 Muscle weakness (generalized): Secondary | ICD-10-CM

## 2019-10-26 DIAGNOSIS — M542 Cervicalgia: Secondary | ICD-10-CM

## 2019-10-26 DIAGNOSIS — M5412 Radiculopathy, cervical region: Secondary | ICD-10-CM

## 2019-10-26 NOTE — Therapy (Signed)
[facility] [address], [place], [address] Phone: [phone]   Fax:  [phone]  Physical Therapy Treatment  Patient Details  Name: Stephanie Russell MRN: [id number] Date of Birth: 18-Aug-1991 Referring Provider (PT): [name], [place]   Encounter Date: 10/26/2019  PT End of Session - 10/26/19 1738    Visit Number  16    Number of Visits  22    Date for PT Re-Evaluation  11/13/19    Authorization Type  [employer] focus reporting period from 10/02/2019    Authorization - Visit Number  6    Authorization - Number of Visits  10    PT Start Time  1696    PT Stop Time  1730    PT Time Calculation (min)  40 min    Activity Tolerance  Patient tolerated treatment well;No increased pain    Behavior During Therapy  [facility] for tasks assessed/performed       History reviewed. No pertinent past medical history.  Past Surgical History:  Procedure Laterality Date  . NO PAST SURGERIES      There were no vitals filed for this visit.  Subjective Assessment - 10/26/19 1649    Subjective  Patient reports she is feeling well today and has not had any migrains or severe R sided pain since last session. She continues to have some discomfort on the L when turning to the left. She has successfully modified HEP to complete off the edge of the bed instead of lying flat.    Pertinent History  Overall healthy and no past surgeries.    Limitations  Lifting;House hold activities    Patient Stated Goals  Reduce headache and neck pain; to be able to have better sleep    Currently in Pain?  Yes    Pain Score  1          TREATMENT: Therapeutic exercise:to centralize symptoms and improve ROM, strength, muscular endurance, and activity tolerance required for successful completion of functional activities.  - seated cervical repeated motions with self overpressure: L rotation, left sidebending, retraction x 10 each. No effect.    Completed as circuit:  - bent over rows with 10# dumbbells, 3x10. From 18 inch chair placed in front of knees to help with bent over form.  - modified push up with plus on treadmill bars, x15. then on back of green chair (32.5 inches) 2x10. Cuing and practice with scapular stability x 8 to learn how to find neutral thoracic spine.   Quadruped: - single arm shoulder extension with scapular retraction x10 each side with 2# weight.  - single arm shoulder horizontal abduction with scapular retraction x10 with 1# weight.  - single arm scaption x10 with no weight.   - supine deep cervical flexion to lift-off 1 second holds, 2x10 with cuing to maintain proper sequencing to prevent overuse of B SCM.  Completed between quadruped exercises during rest for cervical spine extensor.   - discussed updates to HEP to include flexion with lift off as well as one set of I, T, Y from quadruped as practiced today to improve neck endurance and overall shoulder and postural strength/endurance.  Cuing provided to improve form,intensity, reps,techniquesand deep neck flexor activation and cervical extensorscoordination during exercises. Also cued and provided feedback for scapular stability and scapular protraction and neutral spine.   Manual therapy:to reduce pain and tissue tension, improve range of motion, neuromodulation, in order to promote improved ability  to complete functional activities. - STM at posterior neck musculature focusing on R posterior neck muscles, UT, R occipital muscle trigger points, manual traction for pain control.   Therapeutic activity was performed independently and separately from manual interventions and required specific cuing.    HOME EXERCISE PROGRAM Access Code: [email or web address]  URL: [email or web address]  Date: 10/26/2019  Prepared by: Rosita Kea   Exercises Standing Cervical Rotation AROM with Overpressure - repeated motions - 10-15 reps - 1 second hold -  10-20 every 2 hours Supine Segmental Cervical Flexion - 2 sets - 10 reps - 5 seconds hold - 1x daily - 7x weekly Prone Cervical Retraction - 1 sets - 20 reps - 5 seconds hold - 1x daily - 7x weekly Prone Scapular Slide with Shoulder Extension - 3 sets - 10 reps Prone Shoulder Horizontal Abduction with Thumbs Up - 3 sets - 10 reps Prone Single Arm Shoulder Y - 2-3 sets - 10 reps    PT Education - 10/26/19 1738    Education Details  Exercise purpose/form. Self management techniques    Person(s) Educated  Patient    Methods  Explanation;Demonstration;Tactile cues;Verbal cues    Comprehension  Verbalized understanding;Returned demonstration;Verbal cues required;Need further instruction;Tactile cues required       PT Short Term Goals - 09/12/19 1920      PT SHORT TERM GOAL #1   Title  Be independent with initial home exercise program for self-management of symptoms.    Baseline  initial HEP provided at IE (08/31/2019);    Time  2    Period  Weeks    Status  Achieved    Target Date  09/14/19        PT Long Term Goals - 10/02/19 1525      PT LONG TERM GOAL #1   Title  Be independent with a long-term home exercise program for self-management of symptoms.    Baseline  initial HEP provided at IE (08/31/2019); continue updating 10/02/2019    Time  6    Period  Weeks    Status  Partially Met    Target Date  11/13/19      PT LONG TERM GOAL #2   Title  Demonstrate improved FOTO score by 10 units to demonstrate improvement in overall condition and self-reported functional ability.    Baseline  53 (08/31/2019); 55 (10/02/2019)    Time  6    Period  Weeks    Status  Partially Met    Target Date  11/13/19      PT LONG TERM GOAL #3   Title  Reduce pain with functional activities to equal or less than 1/10 to allow patient to complete usual activities including ADLs, IADLs, and social engagement with less difficulty.    Baseline  2-6/10 wose at night (08/31/2019); 5-6/10 (10/02/2019);     Time  6    Period  Weeks    Status  On-going    Target Date  11/13/19      PT LONG TERM GOAL #4   Title  Pt will increase strength of by at least 1/2 MMT grade in order to demonstrate improvement in strength and function.    Baseline  See objective notes at IE (08/31/2019); Deferred to later session due to time(10/02/2019)    Time  6    Period  Weeks    Status  Deferred    Target Date  11/13/19      PT LONG TERM GOAL #5   Title  Complete community, work and/or recreational activities (hot yoga) without limitation due to current condition.    Baseline  Unable to participate hot yoga, holding dog leash with R UE(08/31/2019); continues to avoid things that are too strenuous in yoga, uses dog leash in both hands now, continues to have difficulty with prolonged sitting and studying (10/02/2019);    Time  6    Period  Weeks    Status  On-going    Target Date  11/13/19            Plan - 10/26/19 1746    Clinical Impression Statement  Patient tolerated treatment well and continues to make progress towards goals at this point. Continues to demonstrate improved carry over from previous visits and was able to advance to more challenging postural, cervical spine, and functional exercises today. Able to complete all exercises without lasting increase in pain or discomfort. Demonstrates good ability to learn cuing but requires further supervised practice to master consistently improved form. Continues to demonstrate deficits in muscular endurance, motor control, and strength and would benefit from continued physical therapy continued physical therapy to address remaining impairments and functional limitations to work towards stated goals and return to PLOF or maximal functional independence.    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/Moderate complexity    Rehab Potential  Good    PT Frequency  2x / week    PT Duration  6 weeks    PT Treatment/Interventions  ADLs/Self Care Home Management;Moist Heat;Cryotherapy;Joint Manipulations;Spinal Manipulations;Manual techniques;Therapeutic exercise;Functional mobility training;Therapeutic activities;Electrical Stimulation;Neuromuscular re-education;Patient/family education;Passive range of motion;Dry needling    PT Next Visit Plan  isometrics, manual, deep neck flexor strengthening, consider needling    PT Home Exercise Plan  Medbridge: [email or web address]    Consulted and Agree with Plan of Care  Patient       Patient will benefit from skilled therapeutic intervention in order to improve the following deficits and impairments:  Decreased activity tolerance, Decreased endurance, Decreased strength, Hypermobility, Pain, Impaired UE functional use, Decreased range of motion, Postural dysfunction  Visit Diagnosis: Cervicalgia  Radiculopathy, cervical region  Muscle weakness (generalized)     Problem List There are no problems to display for this patient.   Everlean Alstrom. Graylon Good, PT, DPT 10/26/19, 5:47 PM  [facility] [address], [place], [address] Phone: [phone]   Fax:  [phone]  Name: Stephanie Russell MRN: [id number] Date of Birth: 1991/06/26

## 2019-10-30 ENCOUNTER — Ambulatory Visit: Payer: No Typology Code available for payment source | Admitting: Physical Therapy

## 2019-10-30 ENCOUNTER — Other Ambulatory Visit: Payer: Self-pay

## 2019-10-30 DIAGNOSIS — M542 Cervicalgia: Secondary | ICD-10-CM

## 2019-10-30 DIAGNOSIS — M5412 Radiculopathy, cervical region: Secondary | ICD-10-CM

## 2019-10-30 DIAGNOSIS — M6281 Muscle weakness (generalized): Secondary | ICD-10-CM

## 2019-10-30 NOTE — Therapy (Signed)
[facility] [address], [place], [address] Phone: [phone]   Fax:  [phone]  Physical Therapy Treatment  Patient Details  Name: Stephanie Russell MRN: [id number] Date of Birth: 07/02/1991 Referring Provider (PT): [name], [place]   Encounter Date: 10/30/2019  PT End of Session - 10/30/19 1535    Visit Number  17    Number of Visits  22    Date for PT Re-Evaluation  11/13/19    Authorization Type  [employer] focus reporting period from 10/02/2019    Authorization - Visit Number  7    Authorization - Number of Visits  10    PT Start Time  0272    PT Stop Time  5366    PT Time Calculation (min)  44 min    Activity Tolerance  Patient tolerated treatment well;No increased pain    Behavior During Therapy  [facility] for tasks assessed/performed       No past medical history on file.  Past Surgical History:  Procedure Laterality Date  . NO PAST SURGERIES      There were no vitals filed for this visit.  Subjective Assessment - 10/30/19 1523    Subjective  Patient reports she is feeling well and has some possible soreness in her neck that she attributes to using the muscles more. States she felt okay following last treatment session. HEP is going well and she has been doing 2 sets in quadruped this morning. She reports no pain upon arrival. She has been able to start back to some yoga for the past two days with good results. She feels her pain has been under control for at least a weak now. She continues to be concerned that her pain may worsen again when she starts back to school and clinical rotations.    Pertinent History  Overall healthy and no past surgeries.    Limitations  Lifting;House hold activities    Patient Stated Goals  Reduce headache and neck pain; to be able to have better sleep    Currently in Pain?  No/denies        TREATMENT: Therapeutic exercise:to centralize symptoms and improve  ROM, strength, muscular endurance, and activity tolerance required for successful completion of functional activities.  - Upper body ergometer with no added resistance encourage joint nutrition, warm tissue, induce analgesic effect of aerobic exercise, improve muscular strength and endurance,  and prepare for remainder of session. 5 min during subjective exam.   Completed as circuit:  - bent over rows with 10# dumbbells, 3x15 From 18 inch chair placed in front of knees to help with bent over form.  - modified push up with plus on back of green chair (32.5 inches) 3x15. Cuing and practice with scapular stability x 8 to learn how to find neutral thoracic spine.    Quadruped: - single arm shoulder extension with scapular retraction x10 each side with 3# weight.  - single arm shoulder horizontal abduction with scapular retraction x10 with (3# R and 2# L) weight.  - single arm scaption x10 with 1# weight.  - supine deep cervical flexion to lift-off 1 second holds, 2x15 with cuing to maintain proper sequencing to prevent overuse of B SCM.  Completed between quadruped exercises during rest for cervical spine extensor.   Cuing provided to improve form,intensity, reps,techniquesand deep neck flexor activation and cervical extensorscoordinationduring exercises.Also cued and provided feedback for scapular stability and scapular protraction and neutral spine.  Manual therapy:to reduce pain and tissue tension, improve range of motion, neuromodulation, in order to promote improved ability to complete functional activities. - STM at posterior neck musculature focusing on R posterior neck muscles, UT, R occipital muscle trigger points, manual traction for pain control.  Therapeutic activity was performed independently and separately from manual interventions and required specific cuing.    HOME EXERCISE PROGRAM Access Code: [email or web address]         URL: [email or web address]     Date: 10/26/2019  Prepared by: Rosita Kea   Exercises Standing Cervical Rotation AROM with Overpressure - repeated motions - 10-15 reps - 1 second hold - 10-20 every 2 hours Supine Segmental Cervical Flexion - 2 sets - 10 reps - 5 seconds hold - 1x daily - 7x weekly Prone Cervical Retraction - 1 sets - 20 reps - 5 seconds hold - 1x daily - 7x weekly Prone Scapular Slide with Shoulder Extension - 3 sets - 10 reps Prone Shoulder Horizontal Abduction with Thumbs Up - 3 sets - 10 reps Prone Single Arm Shoulder Y - 2-3 sets - 10 reps    PT Education - 10/30/19 1528    Education Details  Exercise purpose/form. Self management techniques    Person(s) Educated  Patient    Methods  Explanation;Demonstration;Tactile cues;Verbal cues    Comprehension  Verbalized understanding;Returned demonstration;Verbal cues required;Tactile cues required;Need further instruction       PT Short Term Goals - 09/12/19 1920      PT SHORT TERM GOAL #1   Title  Be independent with initial home exercise program for self-management of symptoms.    Baseline  initial HEP provided at IE (08/31/2019);    Time  2    Period  Weeks    Status  Achieved    Target Date  09/14/19        PT Long Term Goals - 10/02/19 1525      PT LONG TERM GOAL #1   Title  Be independent with a long-term home exercise program for self-management of symptoms.    Baseline  initial HEP provided at IE (08/31/2019); continue updating 10/02/2019    Time  6    Period  Weeks    Status  Partially Met    Target Date  11/13/19      PT LONG TERM GOAL #2   Title  Demonstrate improved FOTO score by 10 units to demonstrate improvement in overall condition and self-reported functional ability.    Baseline  53 (08/31/2019); 55 (10/02/2019)    Time  6    Period  Weeks    Status  Partially Met    Target Date  11/13/19      PT LONG TERM GOAL #3   Title  Reduce pain with functional activities to equal or less than 1/10 to allow patient to  complete usual activities including ADLs, IADLs, and social engagement with less difficulty.    Baseline  2-6/10 wose at night (08/31/2019); 5-6/10 (10/02/2019);    Time  6    Period  Weeks    Status  On-going    Target Date  11/13/19      PT LONG TERM GOAL #4   Title  Pt will increase strength of by at least 1/2 MMT grade in order to demonstrate improvement in strength and function.    Baseline  See objective notes at IE (08/31/2019); Deferred to later session due to time(10/02/2019)    Time  6    Period  Weeks  Status  Deferred    Target Date  11/13/19      PT LONG TERM GOAL #5   Title  Complete community, work and/or recreational activities (hot yoga) without limitation due to current condition.    Baseline  Unable to participate hot yoga, holding dog leash with R UE(08/31/2019); continues to avoid things that are too strenuous in yoga, uses dog leash in both hands now, continues to have difficulty with prolonged sitting and studying (10/02/2019);    Time  6    Period  Weeks    Status  On-going    Target Date  11/13/19            Plan - 10/30/19 1617    Clinical Impression Statement  Patient tolerated treatment well and continues to make progress towards goals. She demonstrated improved motor control and muscular endurance with good carry over of cues from past treatment sessions. She reports starting yoga since last session with good tolerance. Patient continues to report concordant pain behind the R eye with pressure to R suboccipitals. Reports traction decreases her pain and may benefit from trial of mechanical traction next session. Patient would benefit from continued physical therapy to address remaining impairments and functional limitations to work towards stated goals and return to PLOF or maximal functional independence.    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    Rehab Potential  Good    PT Frequency  2x / week    PT Duration  6 weeks    PT Treatment/Interventions  ADLs/Self Care Home Management;Moist Heat;Cryotherapy;Joint Manipulations;Spinal Manipulations;Manual techniques;Therapeutic exercise;Functional mobility training;Therapeutic activities;Electrical Stimulation;Neuromuscular re-education;Patient/family education;Passive range of motion;Dry needling    PT Next Visit Plan  isometrics, manual, deep neck flexor strengthening, consider needling    PT Home Exercise Plan  Medbridge: [email or web address]    Consulted and Agree with Plan of Care  Patient       Patient will benefit from skilled therapeutic intervention in order to improve the following deficits and impairments:  Decreased activity tolerance, Decreased endurance, Decreased strength, Hypermobility, Pain, Impaired UE functional use, Decreased range of motion, Postural dysfunction  Visit Diagnosis: Cervicalgia  Radiculopathy, cervical region  Muscle weakness (generalized)     Problem List There are no problems to display for this patient.   Everlean Alstrom. Graylon Good, PT, DPT 10/30/19, 4:19 PM  [facility] [address], [place], [address] Phone: [phone]   Fax:  [phone]  Name: Elleanor Guyett MRN: [id number] Date of Birth: 04-27-1991

## 2019-11-01 ENCOUNTER — Ambulatory Visit: Payer: No Typology Code available for payment source | Admitting: Physical Therapy

## 2019-11-01 ENCOUNTER — Other Ambulatory Visit: Payer: Self-pay

## 2019-11-01 ENCOUNTER — Encounter: Payer: Self-pay | Admitting: Physical Therapy

## 2019-11-01 DIAGNOSIS — M542 Cervicalgia: Secondary | ICD-10-CM

## 2019-11-01 DIAGNOSIS — M5412 Radiculopathy, cervical region: Secondary | ICD-10-CM

## 2019-11-01 DIAGNOSIS — M6281 Muscle weakness (generalized): Secondary | ICD-10-CM

## 2019-11-01 NOTE — Therapy (Signed)
[facility] [address], [place], [address] Phone: [phone]   Fax:  [phone]  Physical Therapy Treatment  Patient Details  Name: Stephanie Russell MRN: [id number] Date of Birth: 03-Mar-1991 Referring Provider (PT): [name], [place]   Encounter Date: 11/01/2019  PT End of Session - 11/01/19 1523    Visit Number  17    Number of Visits  22    Date for PT Re-Evaluation  11/13/19    Authorization Type  [employer] focus reporting period from 10/02/2019    Authorization - Visit Number  8    Authorization - Number of Visits  10    PT Start Time  0370    PT Stop Time  4888    PT Time Calculation (min)  40 min    Activity Tolerance  Patient tolerated treatment well;No increased pain    Behavior During Therapy  [facility] for tasks assessed/performed       History reviewed. No pertinent past medical history.  Past Surgical History:  Procedure Laterality Date  . NO PAST SURGERIES      There were no vitals filed for this visit.  Subjective Assessment - 11/01/19 1518    Subjective  Patient reports she had a migraine on Monday evening and started journaling to track her symptoms. She had done the more intense exercises the morning prior to physical therapy, then again during the session. She also had no caffeine that day and one glass of white wine. The headache started while she was wrapping gifts after dinner. She had light sensitivity. She went to bed, took a Manufacturing engineer and went to bed. She woke up at 4 am because she was hurting so she took some ibuprfen and did her neck exercises and went back to sleep until 8 am. She then awoke and did not have pain or problems the migraine did not come back. She was able to go back to exercises yesterday. She currently has no pain.    Pertinent History  Overall healthy and no past surgeries.    Limitations  Lifting;House hold activities    Patient Stated Goals  Reduce headache and  neck pain; to be able to have better sleep    Currently in Pain?  No/denies       TREATMENT: Therapeutic exercise:to centralize symptoms and improve ROM, strength, muscular endurance, and activity tolerance required for successful completion of functional activities.  - Upper body ergometerwith no added resistanceencourage joint nutrition, warm tissue, induce analgesic effect of aerobic exercise, improve muscular strength and endurance, and prepare for remainder of session. 5 min during subjective exam.   Completed as circuit:  - bent over rows with10# dumbbells, 3x15 From 18 inch chair placed in front of knees to help with bent over form.  - modified push up with plus on back of green chair (32.5 inches) 3x15. Improved carry over from previous session.    Cuing provided to improve form. Improving form from carry over of cuing at previous sessions.   Mechanical Cervical Spine Traction to decrease neck pain and headaches - supine with ~ 20 degrees flexion, 30 seconds pulling at 18-20 lbs, 10 second rest at 0 degrees pull x 10 minutes plus time for set up. Educated pt on procedure and ruled out contraindications for traction. Constant attendance from clinician first session. Patient tolerated well. No increase in pain.   Manual therapy:to reduce pain and tissue tension, improve range of motion, neuromodulation, in  order to promote improved ability to complete functional activities. - STM at posterior neck musculature focusing onR posterior neck muscles, UT, R occipital muscle trigger points, manual traction for pain control.   Therapeutic activity was performed independently and separately from manual interventions and required specific cuing.    HOME EXERCISE PROGRAM Access Code: [email or web address] URL: [email or web address] Date: 10/26/2019  Prepared by: Rosita Kea   Exercises Standing Cervical Rotation AROM with Overpressure - repeated motions -  10-15 reps - 1 second hold - 10-20 every 2 hours Supine Segmental Cervical Flexion - 2 sets - 10 reps - 5 seconds hold - 1x daily - 7x weekly Prone Cervical Retraction - 1 sets - 20 reps - 5 seconds hold - 1x daily - 7x weekly Prone Scapular Slide with Shoulder Extension - 3 sets - 10 reps Prone Shoulder Horizontal Abduction with Thumbs Up - 3 sets - 10 reps Prone Single Arm Shoulder Y - 2-3 sets - 10 reps     PT Education - 11/01/19 1523    Education Details  Self management techniques Exercise purpose/form. Self management techniques    Person(s) Educated  Patient    Methods  Explanation;Demonstration;Tactile cues;Verbal cues    Comprehension  Verbalized understanding;Returned demonstration;Verbal cues required;Tactile cues required;Need further instruction       PT Short Term Goals - 09/12/19 1920      PT SHORT TERM GOAL #1   Title  Be independent with initial home exercise program for self-management of symptoms.    Baseline  initial HEP provided at IE (08/31/2019);    Time  2    Period  Weeks    Status  Achieved    Target Date  09/14/19        PT Long Term Goals - 10/02/19 1525      PT LONG TERM GOAL #1   Title  Be independent with a long-term home exercise program for self-management of symptoms.    Baseline  initial HEP provided at IE (08/31/2019); continue updating 10/02/2019    Time  6    Period  Weeks    Status  Partially Met    Target Date  11/13/19      PT LONG TERM GOAL #2   Title  Demonstrate improved FOTO score by 10 units to demonstrate improvement in overall condition and self-reported functional ability.    Baseline  53 (08/31/2019); 55 (10/02/2019)    Time  6    Period  Weeks    Status  Partially Met    Target Date  11/13/19      PT LONG TERM GOAL #3   Title  Reduce pain with functional activities to equal or less than 1/10 to allow patient to complete usual activities including ADLs, IADLs, and social engagement with less difficulty.    Baseline   2-6/10 wose at night (08/31/2019); 5-6/10 (10/02/2019);    Time  6    Period  Weeks    Status  On-going    Target Date  11/13/19      PT LONG TERM GOAL #4   Title  Pt will increase strength of by at least 1/2 MMT grade in order to demonstrate improvement in strength and function.    Baseline  See objective notes at IE (08/31/2019); Deferred to later session due to time(10/02/2019)    Time  6    Period  Weeks    Status  Deferred    Target Date  11/13/19      PT LONG  TERM GOAL #5   Title  Complete community, work and/or recreational activities (hot yoga) without limitation due to current condition.    Baseline  Unable to participate hot yoga, holding dog leash with R UE(08/31/2019); continues to avoid things that are too strenuous in yoga, uses dog leash in both hands now, continues to have difficulty with prolonged sitting and studying (10/02/2019);    Time  6    Period  Weeks    Status  On-going    Target Date  11/13/19            Plan - 11/01/19 1600    Clinical Impression Statement  Patient tolerated treatment well and continues to make progress towards goals. Does report migraine since last session but was able to recover overnight and is back to exercise program without pain today. Added mechanical traction today due to apparent benefits from manual traction over the course of her care. Started at more gentle pull for first session to prevent severity of any poor response that might be possible with plan to increase pull at next session if no adverse response today. Patient would benefit from continued physical therapy to address remaining impairments and functional limitations to work towards stated goals and return to PLOF or maximal functional independence.    Personal Factors and Comorbidities  Time since onset of injury/illness/exacerbation    Examination-Activity Limitations  Lift;Sleep;Carry;Sit;Other   computer work   Production designer, theatre/television/film work   Merchant navy officer  Evolving/Moderate complexity    Rehab Potential  Good    PT Frequency  2x / week    PT Duration  6 weeks    PT Treatment/Interventions  ADLs/Self Care Home Management;Moist Heat;Cryotherapy;Joint Manipulations;Spinal Manipulations;Manual techniques;Therapeutic exercise;Functional mobility training;Therapeutic activities;Electrical Stimulation;Neuromuscular re-education;Patient/family education;Passive range of motion;Dry needling    PT Next Visit Plan  isometrics, manual, deep neck flexor strengthening, consider needling    PT Home Exercise Plan  Medbridge: [email or web address]    Consulted and Agree with Plan of Care  Patient       Patient will benefit from skilled therapeutic intervention in order to improve the following deficits and impairments:  Decreased activity tolerance, Decreased endurance, Decreased strength, Hypermobility, Pain, Impaired UE functional use, Decreased range of motion, Postural dysfunction  Visit Diagnosis: Cervicalgia  Radiculopathy, cervical region  Muscle weakness (generalized)     Problem List There are no problems to display for this patient.   Everlean Alstrom. Graylon Good, PT, DPT 11/01/19, 4:01 PM  [facility] [address] [place], [address] Phone: [phone]   Fax:  [phone]  Name: Stephanie Russell MRN: [id number] Date of Birth: 18-Mar-1991

## 2019-11-02 ENCOUNTER — Encounter: Payer: No Typology Code available for payment source | Admitting: Physical Therapy

## 2019-11-06 ENCOUNTER — Other Ambulatory Visit: Payer: Self-pay

## 2019-11-06 ENCOUNTER — Encounter: Payer: Self-pay | Admitting: Physical Therapy

## 2019-11-06 ENCOUNTER — Ambulatory Visit: Payer: No Typology Code available for payment source | Admitting: Physical Therapy

## 2019-11-06 DIAGNOSIS — M6281 Muscle weakness (generalized): Secondary | ICD-10-CM

## 2019-11-06 DIAGNOSIS — M542 Cervicalgia: Secondary | ICD-10-CM | POA: Diagnosis not present

## 2019-11-06 DIAGNOSIS — M5412 Radiculopathy, cervical region: Secondary | ICD-10-CM

## 2019-11-06 NOTE — Therapy (Signed)
[facility] [address], [place], [address] Phone: [phone]   Fax:  [phone]  Physical Therapy Treatment  Patient Details  Name: Stephanie Russell MRN: [id number] Date of Birth: May 26, 1991 Referring Provider (PT): [name], [place]   Encounter Date: 11/06/2019  PT End of Session - 11/06/19 1610    Visit Number  19    Number of Visits  22    Date for PT Re-Evaluation  11/13/19    Authorization Type  [facility] [employer] focus reporting period from 10/02/2019    Authorization - Visit Number  9    Authorization - Number of Visits  10    PT Start Time  1602    PT Stop Time  1640    PT Time Calculation (min)  38 min    Activity Tolerance  Patient tolerated treatment well;No increased pain    Behavior During Therapy  [facility] for tasks assessed/performed       History reviewed. No pertinent past medical history.  Past Surgical History:  Procedure Laterality Date  . NO PAST SURGERIES      There were no vitals filed for this visit.  Subjective Assessment - 11/06/19 1607    Subjective  Patient reports she is feeling good upon arrival without any symptoms. She states she felt fine following last treatment sesison but doesn't feel like the traction did much. Is now able to complete all of her scapular HEP in quadruped.    Pertinent History  Overall healthy and no past surgeries.    Limitations  Lifting;House hold activities    Patient Stated Goals  Reduce headache and neck pain; to be able to have better sleep    Currently in Pain?  No/denies      TREATMENT: Therapeutic exercise:to centralize symptoms and improve ROM, strength, muscular endurance, and activity tolerance required for successful completion of functional activities.  -Upper body ergometerwith no added resistanceencourage joint nutrition, warm tissue, induce analgesic effect of aerobic exercise, improve muscular strength and endurance, and  prepare for remainder of session.5 min during subjective exam.  Completed as circuit:  - bent over rows with25# bar on cable, 3x15From 18 inch chair placed in front of knees to help with bent over form.  - modified push up with plus on back of green chair (32.5 inches)3x15. Improved carry over from previous session.  Attempted from seat and arm of chair but too difficult to hold form.   Circuit 2:  - overhead press 2x10 with 3# dumbbells (attempted 5# and too heavy) - lat pull down, 2x10 with 25#    Cuing provided to improve form. Improving form from carry over of cuing at previous sessions.   Manual therapy:to reduce pain and tissue tension, improve range of motion, neuromodulation, in order to promote improved ability to complete functional activities. - STM at posterior neck musculature focusing onR posterior neck muscles, UT, R occipital muscle trigger points, manual traction for pain control.   Therapeutic activity was performed independently and separately from manual interventions and required specific cuing.    HOME EXERCISE PROGRAM Access Code: [email or web address] URL: [email or web address] Date: 10/26/2019  Prepared by: Rosita Kea   Exercises Standing Cervical Rotation AROM with Overpressure - repeated motions - 10-15 reps - 1 second hold - 10-20 every 2 hours Supine Segmental Cervical Flexion - 2 sets - 10 reps - 5 seconds hold - 1x daily - 7x weekly Prone Cervical Retraction - 1 sets - 20 reps -  5 seconds hold - 1x daily - 7x weekly Prone Scapular Slide with Shoulder Extension - 3 sets - 10 reps Prone Shoulder Horizontal Abduction with Thumbs Up - 3 sets - 10 reps Prone Single Arm Shoulder Y - 2-3 sets - 10 reps    PT Education - 11/06/19 1635    Education Details  Exercise purpose/form. Self management techniques    Person(s) Educated  Patient    Methods  Explanation;Demonstration;Tactile cues;Verbal cues    Comprehension   Verbalized understanding;Returned demonstration;Verbal cues required;Tactile cues required       PT Short Term Goals - 09/12/19 1920      PT SHORT TERM GOAL #1   Title  Be independent with initial home exercise program for self-management of symptoms.    Baseline  initial HEP provided at IE (08/31/2019);    Time  2    Period  Weeks    Status  Achieved    Target Date  09/14/19        PT Long Term Goals - 10/02/19 1525      PT LONG TERM GOAL #1   Title  Be independent with a long-term home exercise program for self-management of symptoms.    Baseline  initial HEP provided at IE (08/31/2019); continue updating 10/02/2019    Time  6    Period  Weeks    Status  Partially Met    Target Date  11/13/19      PT LONG TERM GOAL #2   Title  Demonstrate improved FOTO score by 10 units to demonstrate improvement in overall condition and self-reported functional ability.    Baseline  53 (08/31/2019); 55 (10/02/2019)    Time  6    Period  Weeks    Status  Partially Met    Target Date  11/13/19      PT LONG TERM GOAL #3   Title  Reduce pain with functional activities to equal or less than 1/10 to allow patient to complete usual activities including ADLs, IADLs, and social engagement with less difficulty.    Baseline  2-6/10 wose at night (08/31/2019); 5-6/10 (10/02/2019);    Time  6    Period  Weeks    Status  On-going    Target Date  11/13/19      PT LONG TERM GOAL #4   Title  Pt will increase strength of by at least 1/2 MMT grade in order to demonstrate improvement in strength and function.    Baseline  See objective notes at IE (08/31/2019); Deferred to later session due to time(10/02/2019)    Time  6    Period  Weeks    Status  Deferred    Target Date  11/13/19      PT LONG TERM GOAL #5   Title  Complete community, work and/or recreational activities (hot yoga) without limitation due to current condition.    Baseline  Unable to participate hot yoga, holding dog leash with R  UE(08/31/2019); continues to avoid things that are too strenuous in yoga, uses dog leash in both hands now, continues to have difficulty with prolonged sitting and studying (10/02/2019);    Time  6    Period  Weeks    Status  On-going    Target Date  11/13/19            Plan - 11/06/19 1634    Clinical Impression Statement  Patient tolerated treatment well and continues to make progress towards goals. Mechanical traction was  not continued today due to patient not feeling like she benefited from it much. Progressed to more exercises to simulate what patient may do in the gym to support her goal of returning to the gym with her husband. Demonstrated good ability to perform overhead activity but required additional cuing to improve form to prevent return of pain. Continues to benefit from manual therapy and have pain behind R eye when pressing in R suboccipital region. Patient would benefit from continued physical therapy to address remaining impairments and functional limitations to work towards stated goals and return to PLOF or maximal functional independence.    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    Rehab Potential  Good    PT Frequency  2x / week    PT Duration  6 weeks    PT Treatment/Interventions  ADLs/Self Care Home Management;Moist Heat;Cryotherapy;Joint Manipulations;Spinal Manipulations;Manual techniques;Therapeutic exercise;Functional mobility training;Therapeutic activities;Electrical Stimulation;Neuromuscular re-education;Patient/family education;Passive range of motion;Dry needling    PT Next Visit Plan  progressive postural strengthening, manual, deep neck flexor strengthening, consider needling    PT Home  Exercise Plan  Medbridge: [email or web address]    Consulted and Agree with Plan of Care  Patient       Patient will benefit from skilled therapeutic intervention in order to improve the following deficits and impairments:  Decreased activity tolerance, Decreased endurance, Decreased strength, Hypermobility, Pain, Impaired UE functional use, Decreased range of motion, Postural dysfunction  Visit Diagnosis: Cervicalgia  Radiculopathy, cervical region  Muscle weakness (generalized)     Problem List There are no problems to display for this patient.  Everlean Alstrom. Graylon Good, PT, DPT 11/06/19, 4:49 PM   [facility] [address], [place], [address] Phone: [phone]   Fax:  [phone]  Name: Stephanie Russell MRN: [id number] Date of Birth: June 02, 1991

## 2019-11-07 ENCOUNTER — Encounter: Payer: No Typology Code available for payment source | Admitting: Physical Therapy

## 2019-11-13 ENCOUNTER — Ambulatory Visit: Payer: No Typology Code available for payment source | Admitting: Physical Therapy

## 2019-11-16 ENCOUNTER — Encounter: Payer: No Typology Code available for payment source | Admitting: Physical Therapy

## 2019-11-20 ENCOUNTER — Ambulatory Visit: Payer: No Typology Code available for payment source | Admitting: Physical Therapy

## 2019-11-20 ENCOUNTER — Encounter: Payer: Self-pay | Admitting: Physical Therapy

## 2019-11-20 DIAGNOSIS — M5412 Radiculopathy, cervical region: Secondary | ICD-10-CM

## 2019-11-20 DIAGNOSIS — M542 Cervicalgia: Secondary | ICD-10-CM

## 2019-11-20 DIAGNOSIS — M6281 Muscle weakness (generalized): Secondary | ICD-10-CM

## 2019-11-20 NOTE — Therapy (Signed)
[facility] [address], [place], [address] Phone: [phone]   Fax:  [phone]  Physical Therapy No-Visit Discharge Summary Reporting period: 08/31/2019 - 11/20/2019   Patient Details  Name: Stephanie Russell MRN: [id number] Date of Birth: 1990/11/23 Referring Provider (PT): [name], [place]   Encounter Date: 11/20/2019    No past medical history on file.  Past Surgical History:  Procedure Laterality Date  . NO PAST SURGERIES      There were no vitals filed for this visit.  Subjective Assessment - 11/20/19 1606    Subjective  Patient called to cancel future appointments because she has been continuing to feel better and is starting a clinical rotation with a schedule that prevents her from coming in for physical therapy.    Pertinent History  Overall healthy and no past surgeries.    Limitations  Lifting;House hold activities    Patient Stated Goals  Reduce headache and neck pain; to be able to have better sleep       OBJECTIVE Patient is not present for examination at this time. Please see previous documentation for latest objective data.    PT Short Term Goals - 09/12/19 1920      PT SHORT TERM GOAL #1   Title  Be independent with initial home exercise program for self-management of symptoms.    Baseline  initial HEP provided at IE (08/31/2019);    Time  2    Period  Weeks    Status  Achieved    Target Date  09/14/19        PT Long Term Goals - 11/20/19 1607      PT LONG TERM GOAL #1   Title  Be independent with a long-term home exercise program for self-management of symptoms.    Baseline  initial HEP provided at IE (08/31/2019); continue updating 10/02/2019    Time  6    Period  Weeks    Status  Achieved    Target Date  11/13/19      PT LONG TERM GOAL #2   Title  Demonstrate improved FOTO score by 10 units to demonstrate improvement in overall condition and self-reported  functional ability.    Baseline  53 (08/31/2019); 55 (10/02/2019)    Time  6    Period  Weeks    Status  Partially Met    Target Date  11/13/19      PT LONG TERM GOAL #3   Title  Reduce pain with functional activities to equal or less than 1/10 to allow patient to complete usual activities including ADLs, IADLs, and social engagement with less difficulty.    Baseline  2-6/10 wose at night (08/31/2019); 5-6/10 (10/02/2019); most recently patient has been reporting minimal pain with activity (11/20/2019);    Time  6    Period  Weeks    Status  Partially Met    Target Date  11/13/19      PT LONG TERM GOAL #4   Title  Pt will increase strength of by at least 1/2 MMT grade in order to demonstrate improvement in strength and function.    Baseline  See objective notes at IE (08/31/2019); Deferred to later session due to time(10/02/2019); has been participating well in UE strengthening progressing to more reps and bigger resistance (11/20/2019);    Time  6    Period  Weeks    Status  Unable to assess    Target  Date  11/13/19      PT LONG TERM GOAL #5   Title  Complete community, work and/or recreational activities (hot yoga) without limitation due to current condition.    Baseline  Unable to participate hot yoga, holding dog leash with R UE(08/31/2019); continues to avoid things that are too strenuous in yoga, uses dog leash in both hands now, continues to have difficulty with prolonged sitting and studying (10/02/2019); has been able to complete heavier exercises in the clinic (11/20/2019);    Time  6    Period  Weeks    Status  Partially Met    Target Date  11/13/19            Plan - 11/20/19 1612    Clinical Impression Statement  Patient attended 19 physical therapy treatment sessions and made steady progress towards goals. She did not attend the final planned visit and formal measurements were not take at the end of her episode of care but she called to discontinue physical therapy due  to feeling much better and starting a clinical rotation with scheduling conflicts. Patient was able to progress to minimal episodes of migraines and her R arm pain was abolished. She occasionally had R scapular pain per report. She was able to progress to a long term postural strengthening program with progressive increases in resistance and successful completion of strength activities that simulate gym activities she wanted to return to. Patient is now discharged from physical therapy due to patient improvement and patient self-discharge.    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    Rehab Potential  Good    PT Frequency  2x / week    PT Duration  6 weeks    PT Treatment/Interventions  ADLs/Self Care Home Management;Moist Heat;Cryotherapy;Joint Manipulations;Spinal Manipulations;Manual techniques;Therapeutic exercise;Functional mobility training;Therapeutic activities;Electrical Stimulation;Neuromuscular re-education;Patient/family education;Passive range of motion;Dry needling    PT Next Visit Plan  Patient is now discharged from physical therapy due to patient improvement and patient self-discharge    PT [place]: [id number]    Consulted and Agree with Plan of Care  Patient       Patient will benefit from skilled therapeutic intervention in order to improve the following deficits and impairments:  Decreased activity tolerance, Decreased endurance, Decreased strength, Hypermobility, Pain, Impaired UE functional use, Decreased range of motion, Postural dysfunction  Visit Diagnosis: Cervicalgia  Radiculopathy, cervical region  Muscle weakness (generalized)     Problem List There are no problems to  display for this patient.   Everlean Alstrom. Graylon Good, PT, DPT 11/20/19, 4:14 PM  [facility] [address], [place], [address] Phone: [phone]   Fax:  [phone]  Name: Stephanie Russell MRN: [id number] Date of Birth: 06-25-1991

## 2019-11-23 ENCOUNTER — Encounter: Payer: No Typology Code available for payment source | Admitting: Physical Therapy

## 2019-11-27 ENCOUNTER — Encounter: Payer: No Typology Code available for payment source | Admitting: Physical Therapy

## 2019-11-30 ENCOUNTER — Encounter: Payer: No Typology Code available for payment source | Admitting: Physical Therapy

## 2020-07-21 ENCOUNTER — Encounter: Payer: Self-pay | Admitting: Physician Assistant

## 2020-07-24 NOTE — Progress Notes (Signed)
Complete physical exam   Patient: Stephanie Russell   DOB: 08/04/1991   29 y.o. Female  MRN: [id number] Visit Date: 07/25/2020  Today's healthcare provider: [name], PA-C   No chief complaint on file.  Subjective    Stephanie Russell is a 29 y.o. female who presents today for a complete physical exam.  She reports consuming a vegetarian diet. The patient does not participate in regular exercise at present. She generally feels fairly well. She reports sleeping well. She does have additional problems to discuss today.  HPI   Continues with CRNA program.   Anxiety, Follow-up  She was last seen for anxiety 1 years ago. Changes made at last visit include continue wellbutrin. Previously was on prozac but this made her very tired.    She reports excellent compliance with treatment. She reports good tolerance of treatment. She is not having side effects.   She feels her anxiety is moderate and Worse since last visit.  Symptoms: No chest pain No difficulty concentrating  No dizziness No fatigue  No feelings of losing control No insomnia  No irritable No palpitations  No panic attacks No racing thoughts  No shortness of breath No sweating  No tremors/shakes    GAD-7 Results No flowsheet data found.  PHQ-9 Scores PHQ9 SCORE ONLY 07/25/2020 08/17/2019  PHQ-9 Total Score 10 3   Depression, Follow-up  She  was last seen for this 1 years ago. Changes made at last visit include continue wellbutrin.   She reports good compliance with treatment. She is not having side effects.   She reports good tolerance of treatment. Current symptoms include: depressed mood and hypersomnia She feels she is Worse since last visit.  Depression screen [facility] 2/9 07/25/2020 08/17/2019  Decreased Interest 2 1  Down, Depressed, Hopeless 2 0  PHQ - 2 Score 4 1  Altered sleeping 1 1  Tired, decreased energy 3 1  Change in appetite 1 0  Feeling bad or failure about yourself  0 0  Trouble  concentrating 1 0  Moving slowly or fidgety/restless 0 0  Suicidal thoughts 0 0  PHQ-9 Score 10 3  Difficult doing work/chores Somewhat difficult Not difficult at all    -----------------------------------------------------------------------------------------  Neck Pain: Patient has had chronic neck pain for many years. Has been using OTC medications including tylenol, ibuprofen. Has used muscle relaxers. Has done physical therapy with mild improvement but still cites neck pain persisting.  --------------------------------------------------------------------------------------------------- Wt Readings from Last 3 Encounters:  07/25/20 108 lb 9.6 oz (49.3 kg)  08/17/19 114 lb (51.7 kg)     No past medical history on file. Past Surgical History:  Procedure Laterality Date  . NO PAST SURGERIES     Social History   Socioeconomic History  . Marital status: Married    Spouse name: Not on file  . Number of children: Not on file  . Years of education: Not on file  . Highest education level: Not on file  Occupational History  . Not on file  Tobacco Use  . Smoking status: Never Smoker  . Smokeless tobacco: Never Used  Vaping Use  . Vaping Use: Never used  Substance and Sexual Activity  . Alcohol use: Yes  . Drug use: Never  . Sexual activity: Not on file  Other Topics Concern  . Not on file  Social History Narrative  . Not on file   Social Determinants of Health   Financial Resource Strain:   . Difficulty of  Paying Living Expenses: Not on file  Food Insecurity:   . Worried About Charity fundraiser in the Last Year: Not on file  . Ran Out of Food in the Last Year: Not on file  Transportation Needs:   . Lack of Transportation (Medical): Not on file  . Lack of Transportation (Non-Medical): Not on file  Physical Activity:   . Days of Exercise per Week: Not on file  . Minutes of Exercise per Session: Not on file  Stress:   . Feeling of Stress : Not on file  Social  Connections:   . Frequency of Communication with Friends and Family: Not on file  . Frequency of Social Gatherings with Friends and Family: Not on file  . Attends Religious Services: Not on file  . Active Member of Clubs or Organizations: Not on file  . Attends Archivist Meetings: Not on file  . Marital Status: Not on file  Intimate Partner Violence:   . Fear of Current or Ex-Partner: Not on file  . Emotionally Abused: Not on file  . Physically Abused: Not on file  . Sexually Abused: Not on file   Family Status  Relation Name Status  . Mother  Alive  . Father  Other  . MGM  Alive  . MGF  Alive   Family History  Problem Relation Age of Onset  . Drug abuse Mother   . Diabetes Maternal Grandmother   . Hypertension Maternal Grandmother   . Heart attack Maternal Grandfather    Not on File  Patient Care Team: Paulene Floor as PCP - General (Physician Assistant)   Medications: Outpatient Medications Prior to Visit  Medication Sig  . carisoprodol (SOMA) 350 MG tablet   . hydrOXYzine (ATARAX/VISTARIL) 10 MG tablet Take 10 mg by mouth every 8 (eight) hours as needed.  . [DISCONTINUED] buPROPion (WELLBUTRIN XL) 150 MG 24 hr tablet Take 1 tablet (150 mg total) by mouth daily.  . [DISCONTINUED] LO LOESTRIN FE 1 MG-10 MCG / 10 MCG tablet TK 1 T PO D   No facility-administered medications prior to visit.    Review of Systems  Constitutional: Negative.   HENT: Negative.   Eyes: Negative.   Respiratory: Negative.   Cardiovascular: Negative.   Gastrointestinal: Negative.   Endocrine: Negative.   Genitourinary: Negative.   Musculoskeletal: Positive for myalgias, neck pain and neck stiffness.  Skin: Negative.   Allergic/Immunologic: Negative.   Neurological: Positive for headaches.  Hematological: Negative.   Psychiatric/Behavioral: The patient is nervous/anxious.       Objective    BP 116/68 (BP Location: Left Arm, Patient Position: Sitting, Cuff Size:  Normal)   Pulse 75   Temp 98.7 F (37.1 C) (Oral)   Ht '5\' 5"'  (1.651 m)   Wt 108 lb 9.6 oz (49.3 kg)   SpO2 99%   BMI 18.07 kg/m    Physical Exam Constitutional:      Appearance: Normal appearance.  HENT:     Right Ear: Tympanic membrane, ear canal and external ear normal.     Left Ear: Tympanic membrane, ear canal and external ear normal.  Cardiovascular:     Rate and Rhythm: Normal rate and regular rhythm.     Pulses: Normal pulses.     Heart sounds: Normal heart sounds.  Pulmonary:     Effort: Pulmonary effort is normal.     Breath sounds: Normal breath sounds.  Abdominal:     General: Abdomen is flat. Bowel sounds  are normal.     Palpations: Abdomen is soft.  Skin:    General: Skin is warm and dry.  Neurological:     General: No focal deficit present.     Mental Status: She is alert and oriented to person, place, and time.  Psychiatric:        Mood and Affect: Mood normal.        Behavior: Behavior normal.       Last depression screening scores PHQ 2/9 Scores 07/25/2020 08/17/2019  PHQ - 2 Score 4 1  PHQ- 9 Score 10 3   Last fall risk screening Fall Risk  07/25/2020  Falls in the past year? 0  Number falls in past yr: 0  Injury with Fall? 0  Risk for fall due to : No Fall Risks  Follow up Falls evaluation completed   Last Audit-C alcohol use screening Alcohol Use Disorder Test (AUDIT) 07/25/2020  1. How often do you have a drink containing alcohol? 2  2. How many drinks containing alcohol do you have on a typical day when you are drinking? 0  3. How often do you have six or more drinks on one occasion? 0  AUDIT-C Score 2  Alcohol Brief Interventions/Follow-up -   A score of 3 or more in women, and 4 or more in men indicates increased risk for alcohol abuse, EXCEPT if all of the points are from question 1   No results found for any visits on 07/25/20.  Assessment & Plan    Routine Health Maintenance and Physical Exam  Exercise Activities and Dietary  recommendations Goals   None     Immunization History  Administered Date(s) Administered  . Influenza Inj Mdck Quad Pf 07/31/2019, 07/31/2019  . Influenza,inj,Quad PF,6+ Mos 07/25/2020  . MMR 06/28/2019  . PFIZER SARS-COV-2 Vaccination 11/18/2019, 12/08/2019  . Tdap 07/25/2020    Health Maintenance  Topic Date Due  . Hepatitis C Screening  Never done  . HIV Screening  Never done  . PAP-Cervical Cytology Screening  05/20/2021  . PAP SMEAR-Modifier  05/20/2021  . TETANUS/TDAP  07/25/2030  . INFLUENZA VACCINE  Completed  . COVID-19 Vaccine  Completed    Discussed health benefits of physical activity, and encouraged her to engage in regular exercise appropriate for her age and condition. 1. Annual physical exam  - HIV Antibody (routine testing w rflx) - TSH - Lipid panel - Comprehensive metabolic panel - CBC with Differential/Platelet - Hepatitis C antibody - sertraline (ZOLOFT) 50 MG tablet; Take 1 tablet (50 mg total) by mouth daily.  Dispense: 90 tablet; Refill: 1  2. Anxiety and depression  Will Start zoloft 50 mg daily Discussed potential side effects, incl GI upset, sexual dysfunction, increased anxiety, and SI Discussed that it can take 6-8 weeks to reach full efficacy Contracted for safety - no SI/HI Discussed synergistic effects of medications and therapy She declines counseling at this time.  F/u 6 weeks  - buPROPion (WELLBUTRIN XL) 150 MG 24 hr tablet; Take 1 tablet (150 mg total) by mouth daily.  Dispense: 90 tablet; Refill: 3  3. Neck pain  Refer to neurology for consideration of trigger point injections.   - Ambulatory referral to Neurology  4. Encounter for initial prescription of contraceptive pills  - LO LOESTRIN FE 1 MG-10 MCG / 10 MCG tablet; TK 1 T PO D  Dispense: 90 tablet; Refill: 3  5. Encounter for contraceptive management, unspecified type   6. Need for diphtheria-tetanus-pertussis (Tdap) vaccine  -  Tdap vaccine greater than or  equal to 7yo IM  7. Need for influenza vaccination  - Flu Vaccine QUAD 36+ mos IM     No follow-ups on file.     ITrinna Post, PA-C, have reviewed all documentation for this visit. The documentation on 07/25/20 for the exam, diagnosis, procedures, and orders are all accurate and complete.  The entirety of the information documented in the History of Present Illness, Review of Systems and Physical Exam were personally obtained by me. Portions of this information were initially documented by [facility] and reviewed by me for thoroughness and accuracy.     Paulene Floor  [facility] [phone] (phone) [phone] (fax)  [place]

## 2020-07-25 ENCOUNTER — Encounter: Payer: Self-pay | Admitting: Physician Assistant

## 2020-07-25 ENCOUNTER — Ambulatory Visit (INDEPENDENT_AMBULATORY_CARE_PROVIDER_SITE_OTHER): Payer: No Typology Code available for payment source | Admitting: Physician Assistant

## 2020-07-25 ENCOUNTER — Other Ambulatory Visit: Payer: Self-pay

## 2020-07-25 ENCOUNTER — Other Ambulatory Visit: Payer: Self-pay | Admitting: Physician Assistant

## 2020-07-25 VITALS — BP 116/68 | HR 75 | Temp 98.7°F | Ht 65.0 in | Wt 108.6 lb

## 2020-07-25 DIAGNOSIS — Z309 Encounter for contraceptive management, unspecified: Secondary | ICD-10-CM

## 2020-07-25 DIAGNOSIS — F329 Major depressive disorder, single episode, unspecified: Secondary | ICD-10-CM

## 2020-07-25 DIAGNOSIS — Z0000 Encounter for general adult medical examination without abnormal findings: Secondary | ICD-10-CM

## 2020-07-25 DIAGNOSIS — Z23 Encounter for immunization: Secondary | ICD-10-CM | POA: Diagnosis not present

## 2020-07-25 DIAGNOSIS — F419 Anxiety disorder, unspecified: Secondary | ICD-10-CM | POA: Diagnosis not present

## 2020-07-25 DIAGNOSIS — F32A Depression, unspecified: Secondary | ICD-10-CM

## 2020-07-25 DIAGNOSIS — M542 Cervicalgia: Secondary | ICD-10-CM

## 2020-07-25 DIAGNOSIS — Z30011 Encounter for initial prescription of contraceptive pills: Secondary | ICD-10-CM | POA: Diagnosis not present

## 2020-07-25 MED ORDER — SERTRALINE HCL 50 MG PO TABS: 50.0000 mg | ORAL_TABLET | Freq: Every day | ORAL | 1 refills | Status: DC

## 2020-07-25 MED ORDER — BUPROPION HCL ER (XL) 150 MG PO TB24: 150.0000 mg | ORAL_TABLET | Freq: Every day | ORAL | 3 refills | Status: DC

## 2020-07-25 MED ORDER — LO LOESTRIN FE 1 MG-10 MCG / 10 MCG PO TABS: ORAL_TABLET | ORAL | 3 refills | Status: DC

## 2020-07-26 LAB — CBC WITH DIFFERENTIAL/PLATELET
Basophils Absolute: 0 10*3/uL (ref 0.0–0.2)
Basos: 1 %
EOS (ABSOLUTE): 0.1 10*3/uL (ref 0.0–0.4)
Eos: 1 %
Hematocrit: 44.6 % (ref 34.0–46.6)
Hemoglobin: 14.9 g/dL (ref 11.1–15.9)
Immature Grans (Abs): 0 10*3/uL (ref 0.0–0.1)
Immature Granulocytes: 0 %
Lymphocytes Absolute: 1.7 10*3/uL (ref 0.7–3.1)
Lymphs: 36 %
MCH: 29 pg (ref 26.6–33.0)
MCHC: 33.4 g/dL (ref 31.5–35.7)
MCV: 87 fL (ref 79–97)
Monocytes Absolute: 0.3 10*3/uL (ref 0.1–0.9)
Monocytes: 7 %
Neutrophils Absolute: 2.6 10*3/uL (ref 1.4–7.0)
Neutrophils: 55 %
Platelets: 321 10*3/uL (ref 150–450)
RBC: 5.13 x10E6/uL (ref 3.77–5.28)
RDW: 11.7 % (ref 11.7–15.4)
WBC: 4.8 10*3/uL (ref 3.4–10.8)

## 2020-07-26 LAB — COMPREHENSIVE METABOLIC PANEL
ALT: 16 IU/L (ref 0–32)
AST: 17 IU/L (ref 0–40)
Albumin/Globulin Ratio: 1.9 (ref 1.2–2.2)
Albumin: 5 g/dL (ref 3.9–5.0)
Alkaline Phosphatase: 51 IU/L (ref 48–121)
BUN/Creatinine Ratio: 12 (ref 9–23)
BUN: 9 mg/dL (ref 6–20)
Bilirubin Total: 0.7 mg/dL (ref 0.0–1.2)
CO2: 23 mmol/L (ref 20–29)
Calcium: 9.8 mg/dL (ref 8.7–10.2)
Chloride: 101 mmol/L (ref 96–106)
Creatinine, Ser: 0.76 mg/dL (ref 0.57–1.00)
GFR calc Af Amer: 124 mL/min/{1.73_m2} (ref >59)
GFR calc non Af Amer: 108 mL/min/{1.73_m2} (ref >59)
Globulin, Total: 2.6 g/dL (ref 1.5–4.5)
Glucose: 83 mg/dL (ref 65–99)
Potassium: 4.4 mmol/L (ref 3.5–5.2)
Sodium: 139 mmol/L (ref 134–144)
Total Protein: 7.6 g/dL (ref 6.0–8.5)

## 2020-07-26 LAB — LIPID PANEL
Chol/HDL Ratio: 2.6 ratio (ref 0.0–4.4)
Cholesterol, Total: 175 mg/dL (ref 100–199)
HDL: 68 mg/dL (ref >39)
LDL Chol Calc (NIH): 90 mg/dL (ref 0–99)
Triglycerides: 95 mg/dL (ref 0–149)
VLDL Cholesterol Cal: 17 mg/dL (ref 5–40)

## 2020-07-26 LAB — HIV ANTIBODY (ROUTINE TESTING W REFLEX): HIV Screen 4th Generation wRfx: NONREACTIVE

## 2020-07-26 LAB — TSH: TSH: 1.41 u[IU]/mL (ref 0.450–4.500)

## 2020-07-26 LAB — HEPATITIS C ANTIBODY: Hep C Virus Ab: <0.1 s/co ratio (ref 0.0–0.9)

## 2020-09-06 ENCOUNTER — Telehealth (INDEPENDENT_AMBULATORY_CARE_PROVIDER_SITE_OTHER): Payer: No Typology Code available for payment source | Admitting: Physician Assistant

## 2020-09-06 ENCOUNTER — Other Ambulatory Visit: Payer: Self-pay | Admitting: Physician Assistant

## 2020-09-06 DIAGNOSIS — F32A Depression, unspecified: Secondary | ICD-10-CM

## 2020-09-06 DIAGNOSIS — F419 Anxiety disorder, unspecified: Secondary | ICD-10-CM | POA: Diagnosis not present

## 2020-09-06 MED ORDER — CARISOPRODOL 350 MG PO TABS: 350.0000 mg | ORAL_TABLET | Freq: Every day | ORAL | 1 refills | Status: DC

## 2020-09-06 MED ORDER — HYDROXYZINE HCL 10 MG PO TABS: 10.0000 mg | ORAL_TABLET | Freq: Three times a day (TID) | ORAL | 2 refills | Status: DC | PRN

## 2020-09-06 NOTE — Progress Notes (Signed)
MyChart Video Visit    Virtual Visit via Video Note   This visit type was conducted due to national recommendations for restrictions regarding the COVID-19 Pandemic (e.g. social distancing) in an effort to limit this patient's exposure and mitigate transmission in our community. This patient is at least at moderate risk for complications without adequate follow up. This format is felt to be most appropriate for this patient at this time. Physical exam was limited by quality of the video and audio technology used for the visit.   Patient location: Home Provider location: Office   I discussed the limitations of evaluation and management by telemedicine and the availability of in person appointments. The patient expressed understanding and agreed to proceed.  Patient: Stephanie Russell   DOB: 1991-08-05   29 y.o. Female  MRN: [id number] Visit Date: 09/06/2020  Today's healthcare provider: [name], PA-C   Chief Complaint  Patient presents with  . Anxiety   Subjective    HPI   Anxiety, Follow-up  She was last seen for anxiety 1 months ago. Changes made at last visit include adding zoloft 50 mg QD.   She reports excellent compliance with treatment. She reports excellent tolerance of treatment. She is having side effects. She is having some fatigue but would like to start taking the medication at night.   She feels her anxiety is moderate and Improved since last visit. Reports improvement in appetite and eating.   Symptoms: No chest pain No difficulty concentrating  No dizziness No fatigue  No feelings of losing control No insomnia  No irritable No palpitations  No panic attacks No racing thoughts  No shortness of breath No sweating  No tremors/shakes    GAD-7 Results No flowsheet data found.  PHQ-9 Scores PHQ9 SCORE ONLY 09/06/2020 07/25/2020 08/17/2019  PHQ-9 Total Score 6 10 3      ---------------------------------------------------------------------------------------------------     Medications: Outpatient Medications Prior to Visit  Medication Sig  . buPROPion (WELLBUTRIN XL) 150 MG 24 hr tablet Take 1 tablet (150 mg total) by mouth daily.  . carisoprodol (SOMA) 350 MG tablet   . hydrOXYzine (ATARAX/VISTARIL) 10 MG tablet Take 10 mg by mouth every 8 (eight) hours as needed.  . LO LOESTRIN FE 1 MG-10 MCG / 10 MCG tablet TK 1 T PO D  . sertraline (ZOLOFT) 50 MG tablet Take 1 tablet (50 mg total) by mouth daily.   No facility-administered medications prior to visit.    Review of Systems    Objective    There were no vitals taken for this visit.   Physical Exam Constitutional:      Appearance: Normal appearance.  Pulmonary:     Effort: Pulmonary effort is normal. No respiratory distress.  Neurological:     Mental Status: She is alert.  Psychiatric:        Mood and Affect: Mood normal.        Behavior: Behavior normal.        Assessment & Plan    1. Anxiety and depression  Continue zoloft 50 mg in addition to wellbutrin. She feels her mood has improved. Follow up 6 months to 1 year.       I discussed the assessment and treatment plan with the patient. The patient was provided an opportunity to ask questions and all were answered. The patient agreed with the plan and demonstrated an understanding of the instructions.   The patient was advised to call back or seek an in-person evaluation  if the symptoms worsen or if the condition fails to improve as anticipated.   ITrinna Post, PA-C, have reviewed all documentation for this visit. The documentation on 09/10/20 for the exam, diagnosis, procedures, and orders are all accurate and complete.  The entirety of the information documented in the History of Present Illness, Review of Systems and Physical Exam were personally obtained by me. Portions of this information were initially  documented by Wilburt Finlay, CMA and reviewed by me for thoroughness and accuracy.    Paulene Floor [facility] [phone] (phone) [phone] (fax)  [place]

## 2020-09-06 NOTE — Progress Notes (Deleted)
    MyChart Video Visit    Virtual Visit via Video Note   This visit type was conducted due to national recommendations for restrictions regarding the COVID-19 Pandemic (e.g. social distancing) in an effort to limit this patient's exposure and mitigate transmission in our community. This patient is at least at moderate risk for complications without adequate follow up. This format is felt to be most appropriate for this patient at this time. Physical exam was limited by quality of the video and audio technology used for the visit.   Patient location: *** Provider location: ***  I discussed the limitations of evaluation and management by telemedicine and the availability of in person appointments. The patient expressed understanding and agreed to proceed.  Patient: Stephanie Russell   DOB: [birth year]   29 y.o. Female  MRN: [id number] Visit Date: 09/06/2020  Today's healthcare provider: [name], PA-C   No chief complaint on file.  Subjective    HPI  Anxiety, Follow-up  She was last seen for anxiety 1 months ago. Changes made at last visit include starting Zoloft 50mg  daily.   She reports {excellent/good/fair/poor:19665} compliance with treatment. She reports {good/fair/poor:18685} tolerance of treatment. She {is/is not:21021397} having side effects. {document side effects if present:1}  She feels her anxiety is {Desc; severity:60313} and {improved/worse/unchanged:3041574} since last visit.  Symptoms: {Yes/No:20286} chest pain {Yes/No:20286} difficulty concentrating  {Yes/No:20286} dizziness {Yes/No:20286} fatigue  {Yes/No:20286} feelings of losing control {Yes/No:20286} insomnia  {Yes/No:20286} irritable {Yes/No:20286} palpitations  {Yes/No:20286} panic attacks {Yes/No:20286} racing thoughts  {Yes/No:20286} shortness of breath {Yes/No:20286} sweating  {Yes/No:20286} tremors/shakes    GAD-7 Results No flowsheet data found.  PHQ-9 Scores PHQ9 SCORE ONLY 07/25/2020  08/17/2019  PHQ-9 Total Score 10 3     {Show patient history (optional):23778::" "}  Medications: Outpatient Medications Prior to Visit  Medication Sig  . buPROPion (WELLBUTRIN XL) 150 MG 24 hr tablet Take 1 tablet (150 mg total) by mouth daily.  . carisoprodol (SOMA) 350 MG tablet   . hydrOXYzine (ATARAX/VISTARIL) 10 MG tablet Take 10 mg by mouth every 8 (eight) hours as needed.  . LO LOESTRIN FE 1 MG-10 MCG / 10 MCG tablet TK 1 T PO D  . sertraline (ZOLOFT) 50 MG tablet Take 1 tablet (50 mg total) by mouth daily.   No facility-administered medications prior to visit.    Review of Systems  {Heme  Chem  Endocrine  Serology  Results Review (optional):23779::" "}  Objective    There were no vitals taken for this visit. {Show previous vital signs (optional):23777::" "}  Physical Exam     Assessment & Plan     ***  No follow-ups on file.     I discussed the assessment and treatment plan with the patient. The patient was provided an opportunity to ask questions and all were answered. The patient agreed with the plan and demonstrated an understanding of the instructions.   The patient was advised to call back or seek an in-person evaluation if the symptoms worsen or if the condition fails to improve as anticipated.  I provided *** minutes of non-face-to-face time during this encounter.  {provider attestation***:1}  Paulene Floor [facility] [phone] (phone) [phone] (fax)  [place]

## 2020-10-31 ENCOUNTER — Other Ambulatory Visit: Payer: Self-pay | Admitting: Neurology

## 2020-11-01 ENCOUNTER — Other Ambulatory Visit: Payer: Self-pay | Admitting: Neurology

## 2020-11-01 ENCOUNTER — Ambulatory Visit (LOCAL_COMMUNITY_HEALTH_CENTER): Payer: No Typology Code available for payment source

## 2020-11-01 ENCOUNTER — Other Ambulatory Visit: Payer: Self-pay

## 2020-11-01 DIAGNOSIS — M542 Cervicalgia: Secondary | ICD-10-CM

## 2020-11-01 DIAGNOSIS — Z111 Encounter for screening for respiratory tuberculosis: Secondary | ICD-10-CM

## 2020-11-04 ENCOUNTER — Ambulatory Visit (LOCAL_COMMUNITY_HEALTH_CENTER): Payer: No Typology Code available for payment source

## 2020-11-04 ENCOUNTER — Other Ambulatory Visit: Payer: Self-pay

## 2020-11-04 DIAGNOSIS — Z111 Encounter for screening for respiratory tuberculosis: Secondary | ICD-10-CM

## 2020-11-04 LAB — TB SKIN TEST
Induration: 0 mm
TB Skin Test: NEGATIVE

## 2020-11-05 ENCOUNTER — Other Ambulatory Visit: Payer: Self-pay | Admitting: Neurology

## 2020-11-06 ENCOUNTER — Other Ambulatory Visit: Payer: Self-pay | Admitting: Neurology

## 2020-11-06 ENCOUNTER — Other Ambulatory Visit: Payer: Self-pay

## 2020-11-06 ENCOUNTER — Ambulatory Visit
Admission: RE | Admit: 2020-11-06 | Discharge: 2020-11-06 | Disposition: A | Discharge status: Home / Self Care | Payer: No Typology Code available for payment source | Source: Ambulatory Visit | Attending: Neurology | Admitting: Neurology

## 2020-11-06 DIAGNOSIS — M542 Cervicalgia: Secondary | ICD-10-CM | POA: Diagnosis present

## 2020-12-07 ENCOUNTER — Other Ambulatory Visit: Payer: Self-pay | Admitting: Neurology

## 2021-01-05 ENCOUNTER — Encounter: Payer: Self-pay | Admitting: Physician Assistant

## 2021-01-05 DIAGNOSIS — L709 Acne, unspecified: Secondary | ICD-10-CM

## 2021-01-16 ENCOUNTER — Other Ambulatory Visit: Payer: Self-pay | Admitting: Physician Assistant

## 2021-01-16 DIAGNOSIS — Z0000 Encounter for general adult medical examination without abnormal findings: Secondary | ICD-10-CM

## 2021-01-17 ENCOUNTER — Other Ambulatory Visit: Payer: Self-pay | Admitting: Physician Assistant

## 2021-01-30 IMAGING — MR MR CERVICAL SPINE W/O CM
5 series · 40 of 48 positions shown · non-contrast
Comparison: None.

CLINICAL DATA: Chronic neck pain, headaches.  Prior injury.

EXAM:
MRI CERVICAL SPINE WITHOUT CONTRAST
TECHNIQUE: Multiplanar, multisequence MR imaging of the cervical spine was
performed. No intravenous contrast was administered.

[Series 9: T2 · sagittal · 3.0mm · 0.62mm/px · 7 of 13 slices shown (1 of 2)]
[im 1/13]
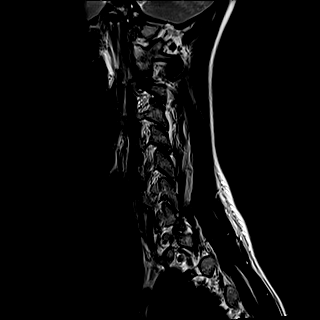
[im 3/13]
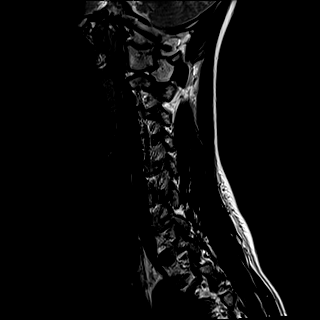
[im 5/13]
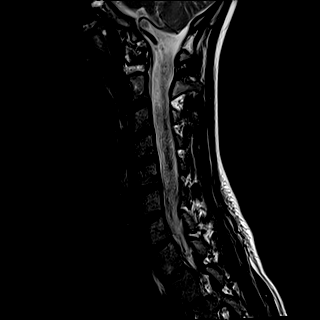
[im 7/13]
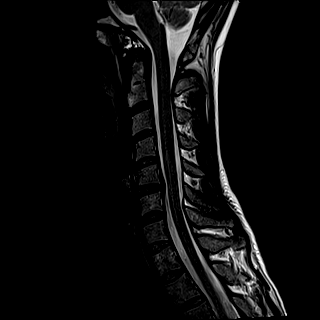
[im 9/13]
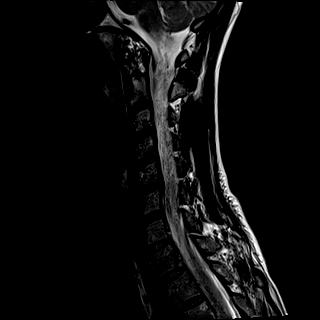
[im 11/13]
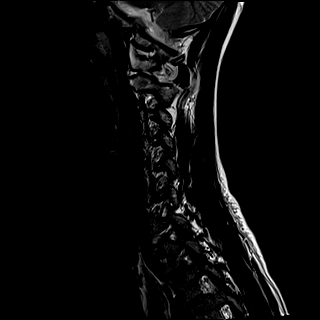
[im 13/13]
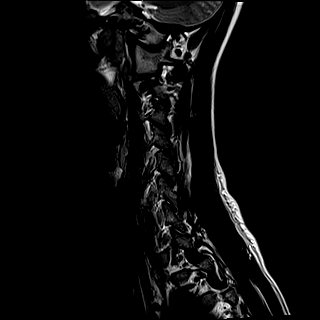

[Series 10: FLAIR · sagittal · 3.0mm · 0.78mm/px · 7 of 13 slices shown]
[im 1/13]
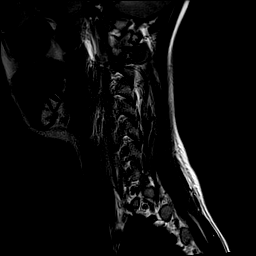
[im 3/13]
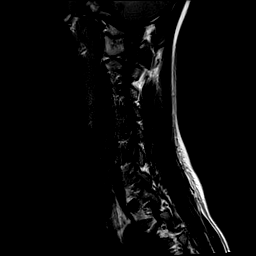
[im 5/13]
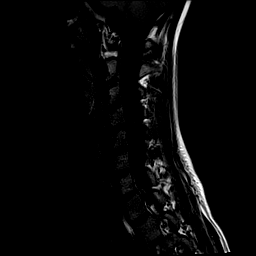
[im 7/13]
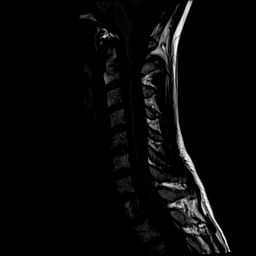
[im 9/13]
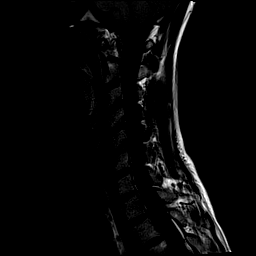
[im 11/13]
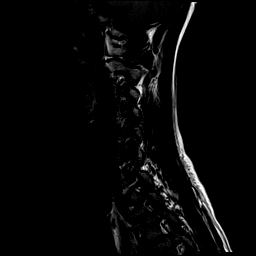
[im 13/13]
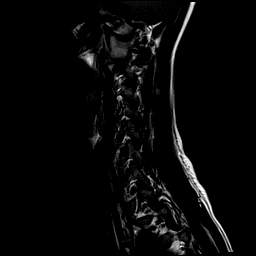

[Series 11: STIR · sagittal · 3.0mm · 0.62mm/px · 6 of 13 slices shown]
[im 1/13]
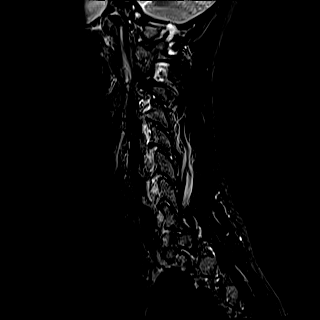
[im 3/13]
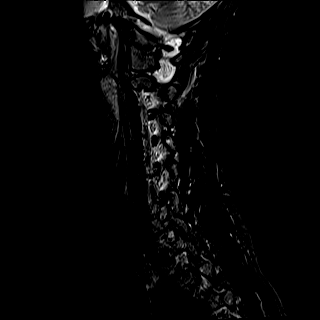
[im 5/13]
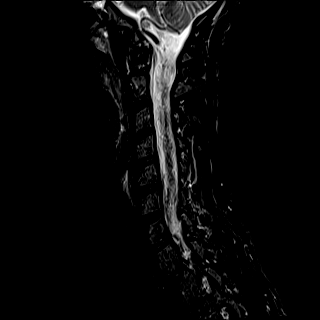
[im 8/13]
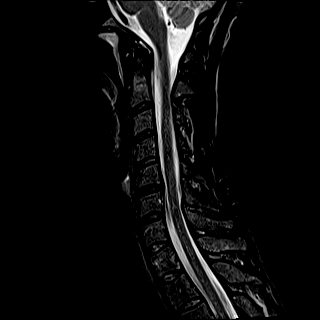
[im 10/13]
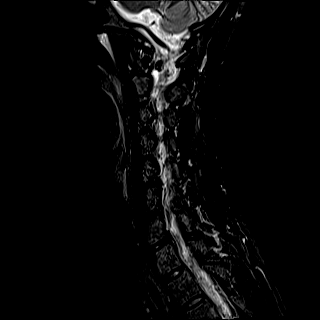
[im 13/13]
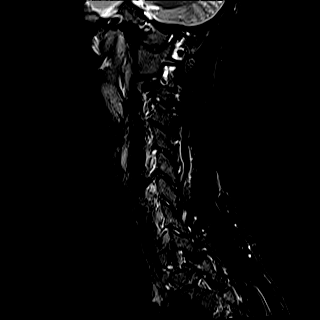

[Series 12: T2 · axial · 3.0mm · 0.70mm/px · z∈[-228,-133]mm · 12 of 29 slices shown (2 of 2)]
[im 1/29]
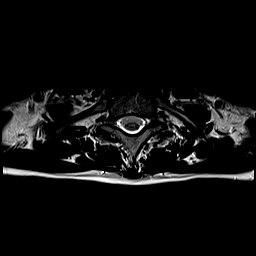
[im 3/29]
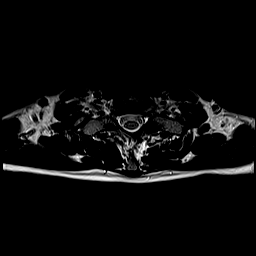
[im 5/29]
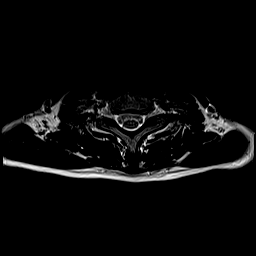
[im 7/29]
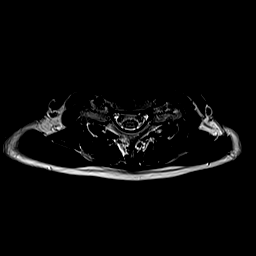
[im 9/29]
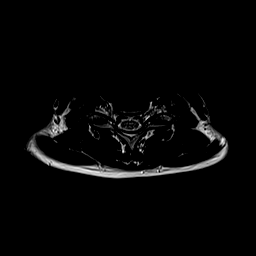
[im 11/29]
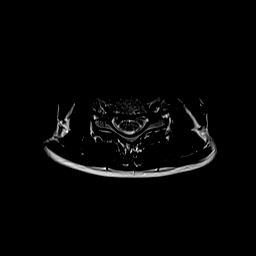
[im 13/29]
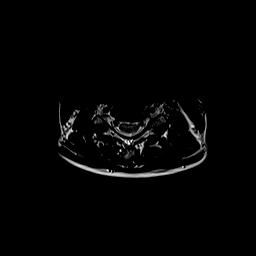
[im 16/29]
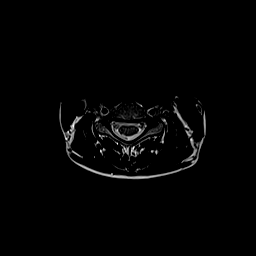
[im 18/29]
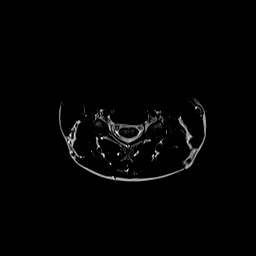
[im 20/29]
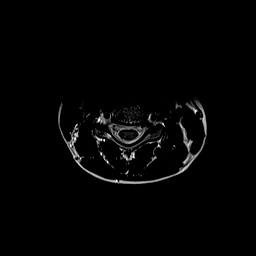
[im 24/29]
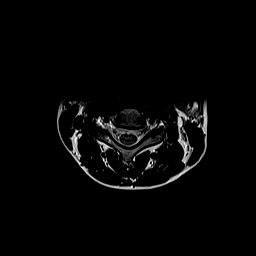
[im 29/29]
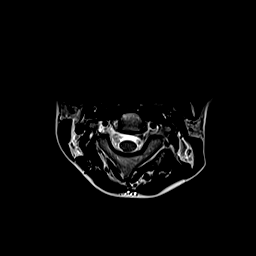

[Series 13: ax mpgr · axial · 3.0mm · 0.35mm/px · z∈[-228,-133]mm · 8 of 29 slices shown]
[im 1/29]
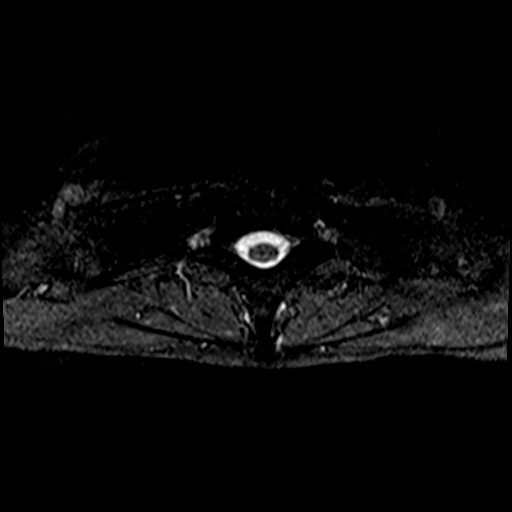
[im 5/29]
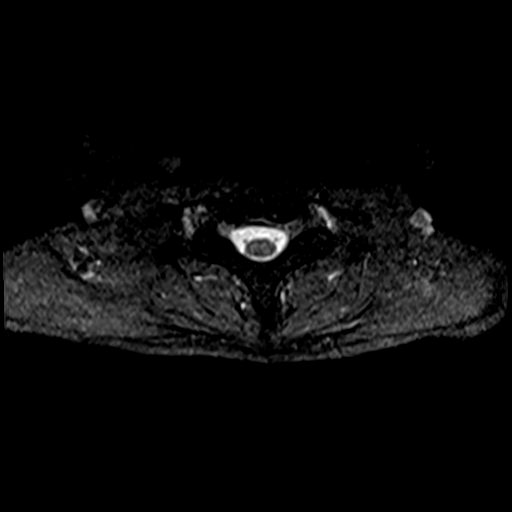
[im 9/29]
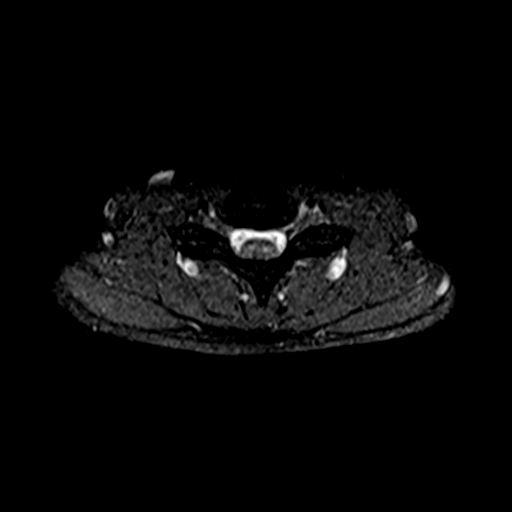
[im 13/29]
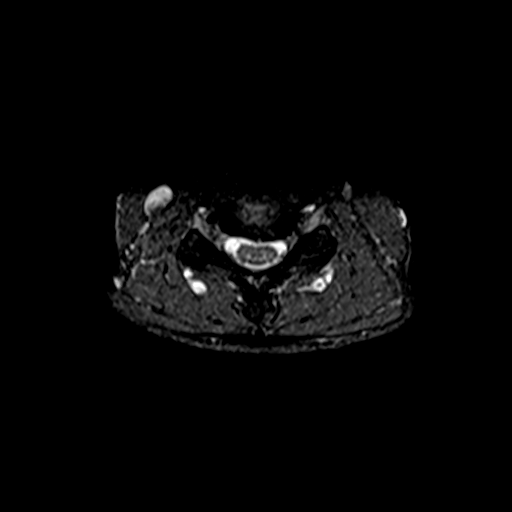
[im 16/29]
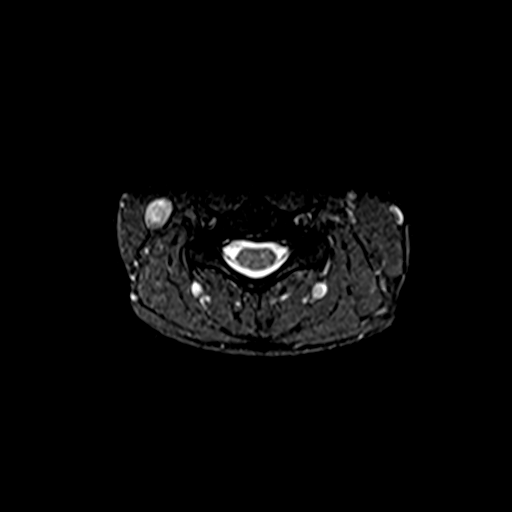
[im 20/29]
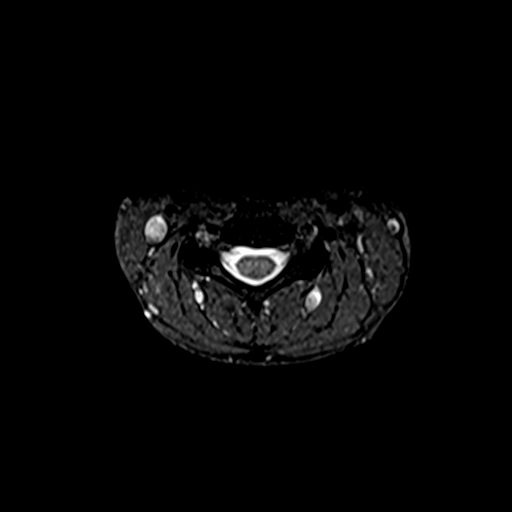
[im 24/29]
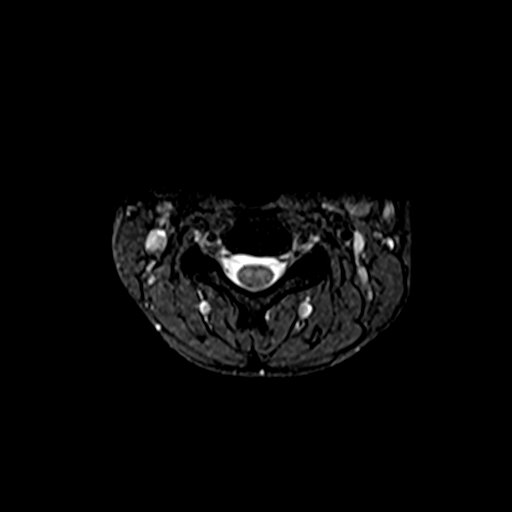
[im 29/29]
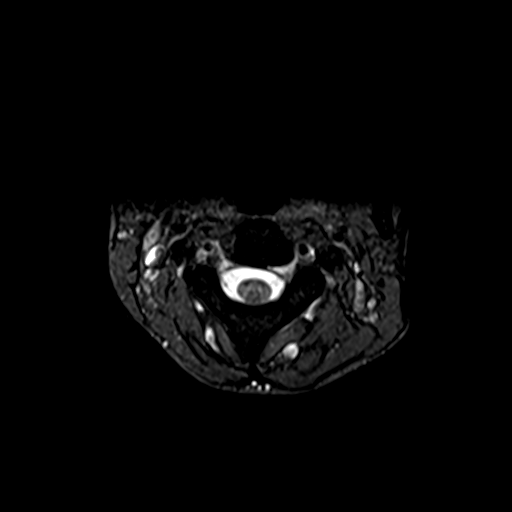

[40 of 48 positions shown; findings below may reference images not displayed]

FINDINGS: Alignment: Normal.

Vertebrae: Normal bone marrow signal intensity. No aggressive
osseous lesion.

Cord: Normal signal and morphology.

Posterior Fossa, vertebral arteries: Negative.

Disc levels: Disc spaces are preserved.

C2-3: No significant disc bulge. Patent spinal canal and neural
foramen.

C3-4: No significant disc bulge. Patent spinal canal and neural
foramen.

C4-5: No significant disc bulge. Patent spinal canal and neural
foramen.

C5-6: Disc osteophyte complex with superimposed left paracentral
protrusion/annular fissuring. Uncovertebral degenerative spurring.
Patent spinal canal and right neural foramen. Mild left neural
foraminal narrowing.

C6-7: No significant disc bulge. Patent spinal canal and neural
foramen.

C7-T1: No significant disc bulge. Patent spinal canal and neural
foramen.

Paraspinal tissues: Negative.
IMPRESSION: Shallow left C5-6 paracentral protrusion with annular fissuring.
Mild left neural foraminal narrowing at this level.

No significant spinal canal narrowing or cord impingement.

## 2021-02-26 ENCOUNTER — Other Ambulatory Visit: Payer: Self-pay

## 2021-02-26 MED FILL — Nortriptyline HCl Cap 10 MG: ORAL | 90 days supply | Qty: 90 | Fill #0 | Status: AC

## 2021-02-26 MED FILL — Hydroxyzine HCl Tab 10 MG: ORAL | 10 days supply | Qty: 30 | Fill #0 | Status: AC

## 2021-03-18 ENCOUNTER — Other Ambulatory Visit: Payer: Self-pay

## 2021-03-18 ENCOUNTER — Ambulatory Visit: Payer: BC Managed Care – PPO | Admitting: Dermatology

## 2021-03-18 ENCOUNTER — Ambulatory Visit: Payer: No Typology Code available for payment source | Admitting: Dermatology

## 2021-03-18 DIAGNOSIS — D171 Benign lipomatous neoplasm of skin and subcutaneous tissue of trunk: Secondary | ICD-10-CM

## 2021-03-18 DIAGNOSIS — L700 Acne vulgaris: Secondary | ICD-10-CM | POA: Diagnosis not present

## 2021-03-18 MED ORDER — WINLEVI 1 % EX CREA: TOPICAL_CREAM | CUTANEOUS | 3 refills | Status: AC

## 2021-03-18 MED ORDER — DOXYCYCLINE HYCLATE 50 MG PO CAPS: 50.0000 mg | ORAL_CAPSULE | Freq: Every day | ORAL | 3 refills | Status: DC

## 2021-03-18 MED ORDER — ADAPALENE 0.3 % EX GEL: CUTANEOUS | 3 refills | Status: AC

## 2021-03-18 NOTE — Progress Notes (Signed)
   New Patient Visit  Subjective  Stephanie Russell is a 30 y.o. female who presents for the following: Acne (Of the face and back - worsening since 2019, has used OTC BC face wash ). Mass on the back for about 2 years that is asymptomatic.   The following portions of the chart were reviewed this encounter and updated as appropriate:   Tobacco  Allergies  Meds  Problems  Med Hx  Surg Hx  Fam Hx     Review of Systems:  No other skin or systemic complaints except as noted in HPI or Assessment and Plan.  Objective  Well appearing patient in no apparent distress; mood and affect are within normal limits.  A focused examination was performed including face, neck, chest and back. Relevant physical exam findings are noted in the Assessment and Plan.  Objective  Face, chest, back: Minimal scarring and perioral papules, some inflamed comedones of the chest and back.   Objective  R low back: 3.0 cm rubbery nodule.  Assessment & Plan  Acne vulgaris -chronic and persistent. Face, chest, back Start Winlevi QAM, Adapalene 0.3% gel QHS, and  Doxycycline 50mg  po QD.   Doxycycline should be taken with food to prevent nausea. Do not lay down for 30 minutes after taking. Be cautious with sun exposure and use good sun protection while on this medication. Pregnant women should not take this medication.    Topical retinoid medications like tretinoin/Retin-A, adapalene/Differin, tazarotene/Fabior, and Epiduo/Epiduo Forte can cause dryness and irritation when first started. Only apply a pea-sized amount to the entire affected area. Avoid applying it around the eyes, edges of mouth and creases at the nose. If you experience irritation, use a good moisturizer first and/or apply the medicine less often. If you are doing well with the medicine, you can increase how often you use it until you are applying every night. Be careful with sun protection while using this medication as it can make you sensitive to the  sun. This medicine should not be used by pregnant women.   doxycycline (VIBRAMYCIN) 50 MG capsule - Face, chest, back  Adapalene (DIFFERIN) 0.3 % gel - Face, chest, back  Clascoterone (WINLEVI) 1 % CREA - Face, chest, back  Lipoma of torso R low back Benign-appearing.  Observation.  Call clinic for new or changing lesions.  Recommend daily use of broad spectrum spf 30+ sunscreen to sun-exposed areas.   If rapidly growing or symptomatic recommend surgical excision.   Return in about 3 months (around 06/18/2021) for acne follow up .  Luther Redo, CMA, am acting as scribe for Sarina Ser, MD .  Documentation: I have reviewed the above documentation for accuracy and completeness, and I agree with the above.  Sarina Ser, MD

## 2021-03-18 NOTE — Patient Instructions (Addendum)
If you have any questions or concerns for your doctor, please call our main line at [phone] and press option 4 to reach your doctor's medical assistant. If no one answers, please leave a voicemail as directed and we will return your call as soon as possible. Messages left after 4 pm will be answered the following business day.   You may also send [place] a message via [place]. We typically respond to MyChart messages within 1-2 business days.  For prescription refills, please ask your pharmacy to contact our office. Our fax number is [phone].  If you have an urgent issue when the clinic is closed that cannot wait until the next business day, you can page your doctor at the number below.    Please note that while we do our best to be available for urgent issues outside of office hours, we are not available 24/7.   If you have an urgent issue and are unable to reach [place], you may choose to seek medical care at your doctor's office, retail clinic, urgent care center, or emergency room.  If you have a medical emergency, please immediately call 911 or go to the emergency department.  Pager Numbers  - Dr. Nehemiah Massed: [phone]  - Dr. Laurence Ferrari: [phone]  - Dr. Nicole Kindred: [phone]  In the event of inclement weather, please call our main line at [phone] for an update on the status of any delays or closures.  Dermatology Medication Tips: Please keep the boxes that topical medications come in in order to help keep track of the instructions about where and how to use these. Pharmacies typically print the medication instructions only on the boxes and not directly on the medication tubes.   If your medication is too expensive, please contact our office at [phone] option 4 or send [place] a message through [place].   We are unable to tell what your co-pay for medications will be in advance as this is different depending on your insurance coverage. However, we may be able to find a substitute  medication at lower cost or fill out paperwork to get insurance to cover a needed medication.   If a prior authorization is required to get your medication covered by your insurance company, please allow [place] 1-2 business days to complete this process.  Drug prices often vary depending on where the prescription is filled and some pharmacies may offer cheaper prices.  The website www.goodrx.com contains coupons for medications through different pharmacies. The prices here do not account for what the cost may be with help from insurance (it may be cheaper with your insurance), but the website can give you the price if you did not use any insurance.  - You can print the associated coupon and take it with your prescription to the pharmacy.  - You may also stop by our office during regular business hours and pick up a GoodRx coupon card.  - If you need your prescription sent electronically to a different pharmacy, notify our office through [facility] or by phone at [phone] option 4.  Doxycycline should be taken with food to prevent nausea. Do not lay down for 30 minutes after taking. Be cautious with sun exposure and use good sun protection while on this medication. Pregnant women should not take this medication.   Topical retinoid medications like tretinoin/Retin-A, adapalene/Differin, tazarotene/Fabior, and Epiduo/Epiduo Forte can cause dryness and irritation when first started. Only apply a pea-sized amount to the entire affected area. Avoid applying it around the eyes,  edges of mouth and creases at the nose. If you experience irritation, use a good moisturizer first and/or apply the medicine less often. If you are doing well with the medicine, you can increase how often you use it until you are applying every night. Be careful with sun protection while using this medication as it can make you sensitive to the sun. This medicine should not be used by pregnant women.

## 2021-03-20 ENCOUNTER — Encounter: Payer: Self-pay | Admitting: Dermatology

## 2021-03-21 ENCOUNTER — Encounter: Payer: Self-pay | Admitting: Dermatology

## 2021-03-28 ENCOUNTER — Telehealth: Payer: Self-pay

## 2021-03-28 DIAGNOSIS — L700 Acne vulgaris: Secondary | ICD-10-CM

## 2021-03-28 NOTE — Telephone Encounter (Signed)
Adapalene 0.3% gel not covered by insurance. Patient must first try and fail generic Tretinoin, generic Clindamycin gel/lotion, generic Epiduo, generic Azelaic acid 15% and generic Tazorac 0.1% cream (must have first tried Tretinoin). Would you like to change prescription or send Adapalene to [place]?

## 2021-03-31 MED ORDER — TRETINOIN 0.025 % EX CREA: TOPICAL_CREAM | Freq: Every day | CUTANEOUS | 0 refills | Status: AC

## 2021-03-31 NOTE — Telephone Encounter (Signed)
Send Tretinoin 0.025% cr qhs for acne disp trade (30g?) with 3 rf

## 2021-03-31 NOTE — Telephone Encounter (Signed)
Left message on voicemail to return my call.  

## 2021-04-21 ENCOUNTER — Other Ambulatory Visit: Payer: Self-pay

## 2021-04-21 MED FILL — Norethin-Eth Estradiol-Fe Tab 1 MG-10 MCG (24)/10 MCG (2): ORAL | 28 days supply | Qty: 28 | Fill #0 | Status: CN

## 2021-04-21 MED FILL — Bupropion HCl Tab ER 24HR 150 MG: ORAL | 90 days supply | Qty: 90 | Fill #0 | Status: AC

## 2021-04-21 MED FILL — Norethin-Eth Estradiol-Fe Tab 1 MG-10 MCG (24)/10 MCG (2): ORAL | 84 days supply | Qty: 84 | Fill #0 | Status: CN

## 2021-04-21 MED FILL — Sertraline HCl Tab 50 MG: ORAL | 90 days supply | Qty: 90 | Fill #0 | Status: AC

## 2021-04-24 ENCOUNTER — Other Ambulatory Visit: Payer: Self-pay

## 2021-06-09 ENCOUNTER — Ambulatory Visit: Payer: BC Managed Care – PPO | Admitting: Dermatology

## 2021-07-09 ENCOUNTER — Other Ambulatory Visit: Payer: Self-pay | Admitting: Dermatology

## 2021-07-09 DIAGNOSIS — L700 Acne vulgaris: Secondary | ICD-10-CM

## 2021-07-14 ENCOUNTER — Ambulatory Visit: Payer: BC Managed Care – PPO | Admitting: Dermatology
